# Patient Record
Sex: Female | Born: 1990 | Hispanic: No | Marital: Single | Smoking: Never smoker
Health system: Southern US, Community
[De-identification: ages and names within clinical notes are randomized; demographics above are authoritative.]

---

## 2019-01-20 ENCOUNTER — Other Ambulatory Visit: Payer: Self-pay

## 2019-01-20 ENCOUNTER — Encounter (HOSPITAL_COMMUNITY): Payer: Self-pay | Admitting: Emergency Medicine

## 2019-01-20 ENCOUNTER — Emergency Department (HOSPITAL_COMMUNITY): Payer: BC Managed Care – PPO

## 2019-01-20 ENCOUNTER — Emergency Department (HOSPITAL_COMMUNITY)
Admission: EM | Admit: 2019-01-20 | Discharge: 2019-01-20 | Disposition: A | Discharge status: Home / Self Care | Payer: BC Managed Care – PPO | Attending: Emergency Medicine | Admitting: Emergency Medicine

## 2019-01-20 DIAGNOSIS — Z20828 Contact with and (suspected) exposure to other viral communicable diseases: Secondary | ICD-10-CM | POA: Diagnosis not present

## 2019-01-20 DIAGNOSIS — R197 Diarrhea, unspecified: Secondary | ICD-10-CM

## 2019-01-20 DIAGNOSIS — R112 Nausea with vomiting, unspecified: Secondary | ICD-10-CM | POA: Insufficient documentation

## 2019-01-20 DIAGNOSIS — R0981 Nasal congestion: Secondary | ICD-10-CM | POA: Diagnosis present

## 2019-01-20 DIAGNOSIS — Z20822 Contact with and (suspected) exposure to covid-19: Secondary | ICD-10-CM

## 2019-01-20 LAB — LIPASE, BLOOD: Lipase: 19 U/L (ref 11–51)

## 2019-01-20 LAB — CBC WITH DIFFERENTIAL/PLATELET
Abs Immature Granulocytes: 0.01 10*3/uL (ref 0.00–0.07)
Basophils Absolute: 0 10*3/uL (ref 0.0–0.1)
Basophils Relative: 0 %
Eosinophils Absolute: 0.1 10*3/uL (ref 0.0–0.5)
Eosinophils Relative: 1 %
HCT: 40 % (ref 36.0–46.0)
Hemoglobin: 13 g/dL (ref 12.0–15.0)
Immature Granulocytes: 0 %
Lymphocytes Relative: 27 %
Lymphs Abs: 1.4 10*3/uL (ref 0.7–4.0)
MCH: 28.8 pg (ref 26.0–34.0)
MCHC: 32.5 g/dL (ref 30.0–36.0)
MCV: 88.7 fL (ref 80.0–100.0)
Monocytes Absolute: 0.5 10*3/uL (ref 0.1–1.0)
Monocytes Relative: 9 %
Neutro Abs: 3.2 10*3/uL (ref 1.7–7.7)
Neutrophils Relative %: 63 %
Platelets: 301 10*3/uL (ref 150–400)
RBC: 4.51 MIL/uL (ref 3.87–5.11)
RDW: 14.6 % (ref 11.5–15.5)
WBC: 5.1 10*3/uL (ref 4.0–10.5)
nRBC: 0 % (ref 0.0–0.2)

## 2019-01-20 LAB — COMPREHENSIVE METABOLIC PANEL
ALT: 17 U/L (ref 0–44)
AST: 19 U/L (ref 15–41)
Albumin: 3.2 g/dL — ABNORMAL LOW (ref 3.5–5.0)
Alkaline Phosphatase: 47 U/L (ref 38–126)
Anion gap: 11 (ref 5–15)
BUN: 8 mg/dL (ref 6–20)
CO2: 19 mmol/L — ABNORMAL LOW (ref 22–32)
Calcium: 8.7 mg/dL — ABNORMAL LOW (ref 8.9–10.3)
Chloride: 108 mmol/L (ref 98–111)
Creatinine, Ser: 0.7 mg/dL (ref 0.44–1.00)
GFR calc Af Amer: >60 mL/min (ref >60)
GFR calc non Af Amer: >60 mL/min (ref >60)
Glucose, Bld: 97 mg/dL (ref 70–99)
Potassium: 3.5 mmol/L (ref 3.5–5.1)
Sodium: 138 mmol/L (ref 135–145)
Total Bilirubin: 0.3 mg/dL (ref 0.3–1.2)
Total Protein: 6.3 g/dL — ABNORMAL LOW (ref 6.5–8.1)

## 2019-01-20 LAB — I-STAT BETA HCG BLOOD, ED (MC, WL, AP ONLY): I-stat hCG, quantitative: <5 m[IU]/mL (ref <5)

## 2019-01-20 LAB — SARS CORONAVIRUS 2 (TAT 6-24 HRS): SARS Coronavirus 2: NEGATIVE

## 2019-01-20 MED ORDER — ONDANSETRON HCL 4 MG PO TABS: 4.0000 mg | ORAL_TABLET | Freq: Three times a day (TID) | ORAL | 0 refills | Status: DC | PRN

## 2019-01-20 MED ORDER — SODIUM CHLORIDE 0.9 % IV BOLUS
500.0000 mL | Freq: Once | INTRAVENOUS | Status: AC
  Administered 2019-01-20: 500 mL via INTRAVENOUS

## 2019-01-20 MED ORDER — METHYLPREDNISOLONE SODIUM SUCC 125 MG IJ SOLR
125.0000 mg | Freq: Once | INTRAMUSCULAR | Status: AC
  Administered 2019-01-20: 12:00:00 125 mg via INTRAVENOUS
  Filled 2019-01-20: qty 2

## 2019-01-20 MED ORDER — FLUTICASONE PROPIONATE 50 MCG/ACT NA SUSP: 2.0000 | Freq: Every day | NASAL | 0 refills | Status: DC

## 2019-01-20 MED ORDER — ONDANSETRON HCL 4 MG/2ML IJ SOLN
4.0000 mg | Freq: Once | INTRAMUSCULAR | Status: AC
  Administered 2019-01-20: 4 mg via INTRAVENOUS
  Filled 2019-01-20: qty 2

## 2019-01-20 MED ORDER — ALBUTEROL SULFATE HFA 108 (90 BASE) MCG/ACT IN AERS
1.0000 | INHALATION_SPRAY | Freq: Once | RESPIRATORY_TRACT | Status: AC
  Administered 2019-01-20: 13:00:00 1 via RESPIRATORY_TRACT
  Filled 2019-01-20: qty 6.7

## 2019-01-20 MED ORDER — PREDNISONE 10 MG PO TABS: 40.0000 mg | ORAL_TABLET | Freq: Every day | ORAL | 0 refills | Status: AC

## 2019-01-20 MED ORDER — BENZONATATE 100 MG PO CAPS: 100.0000 mg | ORAL_CAPSULE | Freq: Three times a day (TID) | ORAL | 0 refills | Status: DC | PRN

## 2019-01-20 NOTE — ED Provider Notes (Signed)
[facility] EMERGENCY DEPARTMENT Provider Note   CSN: [id number] Arrival date & time: 01/20/19  1035     History   Chief Complaint Chief Complaint  Patient presents with  . COVID-19 Sx    HPI Cassandra Houston is a 28 y.o. female presents today for evaluation of acute onset, progressively worsening nasal congestion, productive cough, fever, nausea vomiting for 4-5 days.  Reports she found out her brother tested positive for COVID-19 2 days ago and states that she spent a week with him recently.  She had fever up to 102 F but this resolved yesterday.  She has had nausea and vomiting which she states occurs after smelling certain foods or eating certain foods.  Reports 5 episodes of nonbloody nonbilious emesis yesterday and states she was unable to keep down any food or fluids.  Denies any abdominal pain.  Notes some mild chest soreness with cough only.  Also notes some dyspnea on exertion, denies any shortness of breath at rest.  Reports cough is productive of brown sputum.  She is a current smoker of 1/4 pack of cigarettes daily, denies recreational drug use.  Has had some soft bowel movements but denies constipation, melena, or hematochezia.  No urinary symptoms.  Has been taking over-the-counter allergy medications without relief.     The history is provided by the patient.    History reviewed. No pertinent past medical history.  There are no active problems to display for this patient.     OB History   No obstetric history on file.      Home Medications    Prior to Admission medications   Medication Sig Start Date End Date Taking? Authorizing Provider  benzonatate (TESSALON) 100 MG capsule Take 1 capsule (100 mg total) by mouth 3 (three) times daily as needed for cough. 01/20/19   Olga Seyler A, PA-C  fluticasone (FLONASE) 50 MCG/ACT nasal spray Place 2 sprays into both nostrils daily. 01/20/19   Renn Dirocco A, PA-C  ondansetron (ZOFRAN) 4 MG tablet Take  1 tablet (4 mg total) by mouth every 8 (eight) hours as needed for nausea or vomiting. 01/20/19   Curlie Sittner A, PA-C  predniSONE (DELTASONE) 10 MG tablet Take 4 tablets (40 mg total) by mouth daily with breakfast for 5 days. 01/20/19 01/25/19  Jeanie Sewer, PA-C    Family History No family history on file.  Social History Social History   Tobacco Use  . Smoking status: Not on file  Substance Use Topics  . Alcohol use: Not on file  . Drug use: Not on file     Allergies   Patient has no known allergies.   Review of Systems Review of Systems  Constitutional: Positive for chills, fatigue and fever.  HENT: Positive for congestion. Negative for sore throat.   Respiratory: Positive for cough and shortness of breath.   Cardiovascular: Positive for chest pain (with cough only).  Gastrointestinal: Positive for diarrhea, nausea and vomiting. Negative for abdominal pain and constipation.  Genitourinary: Negative for dysuria, frequency, hematuria and urgency.  All other systems reviewed and are negative.    Physical Exam Updated Vital Signs BP 128/69 (BP Location: Right Arm)   Pulse 75   Temp 98.1 F (36.7 C) (Oral)   Resp 15   Ht 5' 4.5" (1.638 m)   Wt 132.5 kg   SpO2 99%   BMI 49.35 kg/m   Physical Exam Vitals signs and nursing note reviewed.  Constitutional:  General: She is not in acute distress.    Appearance: She is well-developed.  HENT:     Head: Normocephalic and atraumatic.  Eyes:     General:        Right eye: No discharge.        Left eye: No discharge.     Conjunctiva/sclera: Conjunctivae normal.  Neck:     Vascular: No JVD.     Trachea: No tracheal deviation.  Cardiovascular:     Rate and Rhythm: Normal rate and regular rhythm.  Pulmonary:     Effort: Pulmonary effort is normal.     Breath sounds: Wheezing present.     Comments: Diffuse wheezes, speaking in full sentences without difficulty. Ambulated in room with SpO2 saturations 96-98% on  room air Abdominal:     General: Bowel sounds are normal. There is no distension.     Palpations: Abdomen is soft.     Tenderness: There is no abdominal tenderness. There is no right CVA tenderness, left CVA tenderness, guarding or rebound.  Skin:    General: Skin is warm and dry.     Findings: No erythema.  Neurological:     Mental Status: She is alert.  Psychiatric:        Behavior: Behavior normal.      ED Treatments / Results  Labs (all labs ordered are listed, but only abnormal results are displayed) Labs Reviewed  COMPREHENSIVE METABOLIC PANEL - Abnormal; Notable for the following components:      Result Value   CO2 19 (*)    Calcium 8.7 (*)    Total Protein 6.3 (*)    Albumin 3.2 (*)    All other components within normal limits  SARS CORONAVIRUS 2 (TAT 6-24 HRS)  CBC WITH DIFFERENTIAL/PLATELET  LIPASE, BLOOD  I-STAT BETA HCG BLOOD, ED ([facility], [facility], [facility] ONLY)    EKG None  Radiology Dg Chest Portable 1 View  Result Date: 01/20/2019 CLINICAL DATA:  Cough, fever EXAM: PORTABLE CHEST 1 VIEW COMPARISON:  None. FINDINGS: Normal heart size. Normal mediastinal contour. No pneumothorax. No pleural effusion. Lungs appear clear, with no acute consolidative airspace disease and no pulmonary edema. IMPRESSION: No active disease. Electronically Signed   By: Ilona Sorrel M.D.   On: 01/20/2019 12:18    Procedures Procedures (including critical care time)  Medications Ordered in ED Medications  sodium chloride 0.9 % bolus 500 mL (0 mLs Intravenous Stopped 01/20/19 1446)  ondansetron (ZOFRAN) injection 4 mg (4 mg Intravenous Given 01/20/19 1229)  methylPREDNISolone sodium succinate (SOLU-MEDROL) 125 mg/2 mL injection 125 mg (125 mg Intravenous Given 01/20/19 1229)  albuterol (VENTOLIN HFA) 108 (90 Base) MCG/ACT inhaler 1 puff (1 puff Inhalation Given 01/20/19 1232)     Initial Impression / Assessment and Plan / ED Course  I have reviewed the triage vital signs and the nursing  notes.  Pertinent labs & imaging results that were available during my care of the patient were reviewed by me and considered in my medical decision making (see chart for details).        Cassandra Houston was evaluated in Emergency Department on 01/20/2019 for the symptoms described in the history of present illness. She was evaluated in the context of the global COVID-19 pandemic, which necessitated consideration that the patient might be at risk for infection with the SARS-CoV-2 virus that causes COVID-19. Institutional protocols and algorithms that pertain to the evaluation of patients at risk for COVID-19 are in a state of rapid change based  on information released by regulatory bodies including the CDC and federal and state organizations. These policies and algorithms were followed during the patient's care in the ED.  Patient presenting for evaluation of 4 to 5-day history of nasal congestion, productive cough, dyspnea on exertion, nausea vomiting, diarrhea.  She is afebrile, vital signs are stable.  She is nontoxic in appearance.  Has had known Covid exposure recently.  She was ambulated in the room with stable SPO2 saturation, no complaint of severe shortness of breath.  No evidence of respiratory distress today.  Chest x-ray shows no acute cardiopulmonary abnormalities.  She does have diffuse wheezes on auscultation of the lungs but this improved with albuterol and Solu-Medrol.  Her abdomen is soft and nontender, doubt acute surgical abdominal pathology.  Lab work reviewed by me shows no leukocytosis, no anemia, no metabolic derangements, no renal insufficiency.  LFTs and lipase are within normal limits.  She is not pregnant.  Suspect likely COVID-19 infection given recent exposure.  She was given Zofran in the ED and on reevaluation resting comfortably in no apparent distress, serial abdominal examinations remain benign and she is tolerating p.o. fluids without difficulty.  Discussed symptomatic  management, follow-up with PCP, and quarantining at home per current CDC guidelines.  Discussed strict ED return precautions. Patient verbalized understanding of and agreement with plan and is safe for discharge home at this time.   Final Clinical Impressions(s) / ED Diagnoses   Final diagnoses:  Suspected COVID-19 virus infection  Nausea vomiting and diarrhea    ED Discharge Orders         Ordered    predniSONE (DELTASONE) 10 MG tablet  Daily with breakfast     01/20/19 1447    ondansetron (ZOFRAN) 4 MG tablet  Every 8 hours PRN     01/20/19 1447    benzonatate (TESSALON) 100 MG capsule  3 times daily PRN     01/20/19 1447    fluticasone (FLONASE) 50 MCG/ACT nasal spray  Daily     01/20/19 1447           Jeanie SewerFawze, Peytin Dechert A, PA-C 01/20/19 1456    Milagros Lollykstra, Richard S, MD 01/21/19 [id number]

## 2019-01-20 NOTE — ED Notes (Signed)
X-ray at bedside

## 2019-01-20 NOTE — Discharge Instructions (Signed)
Your Covid test will result within 24 to 48 hours.  You can check your results on MyChart.  Instructions to access my chart or in this paperwork.  You can take 1 to 2 tablets of Tylenol every 4-6 hours as needed for fever pain.  Zofran as needed for nausea and vomiting.  Let this medicine dissolve under your tongue and wait around 10 to 15 minutes before you have anything to eat or drink.  Take Tessalon as needed for cough.  Flonase for nasal congestion.  Use the albuterol inhaler 1 to 2 puffs every 4-6 hours as needed for shortness of breath.  Start taking prednisone beginning tomorrow, you received the first dose in the emergency department today.  Do not take any ibuprofen, Advil, Aleve, or Motrin while taking prednisone.  Follow-up with primary care provider for reevaluation of symptoms.  Return to the emergency department if any concerning signs or symptoms develop such as shortness of breath, chest pains, persistent vomiting, or loss of consciousness.

## 2019-01-20 NOTE — ED Notes (Signed)
Patient given sprite and graham crackers °

## 2019-01-20 NOTE — ED Triage Notes (Signed)
-  Patient is here with reports of cough, congestion, nausea and fever for about 3 days. fever resolved yesterday. -Took some allergy medicines with no resolution  -Patient states she was with her brother for one week Brother was positive for COVID-19 2 days ago

## 2020-05-02 ENCOUNTER — Telehealth: Payer: Medicaid Other | Admitting: Oncology

## 2020-05-02 NOTE — Progress Notes (Unsigned)
Cassandra Houston, manges are scheduled for a virtual visit with your provider today.    Just as we do with appointments in the office, we must obtain your consent to participate.  Your consent will be active for this visit and any virtual visit you may have with one of our providers in the next 365 days.    If you have a MyChart account, I can also send a copy of this consent to you electronically.  All virtual visits are billed to your insurance company just like a traditional visit in the office.  As this is a virtual visit, video technology does not allow for your provider to perform a traditional examination.  This may limit your provider's ability to fully assess your condition.  If your provider identifies any concerns that need to be evaluated in person or the need to arrange testing such as labs, EKG, etc, we will make arrangements to do so.    Although advances in technology are sophisticated, we cannot ensure that it will always work on either your end or our end.  If the connection with a video visit is poor, we may have to switch to a telephone visit.  With either a video or telephone visit, we are not always able to ensure that we have a secure connection.   I need to obtain your verbal consent now.   Are you willing to proceed with your visit today?   Cassandra Houston has provided verbal consent on 05/02/2020 for a virtual visit (video or telephone).   Mauro Kaufmann, NP 05/02/2020  5:30 PM  Virtual Visit via Telephone Note  I connected with Cassandra Houston on 05/02/20 at  5:30 PM EST by telephone and verified that I am speaking with the correct person using two identifiers.  Location: Patient: Home Provider: Clinic    I discussed the limitations, risks, security and privacy concerns of performing an evaluation and management service by telephone and the availability of in person appointments. I also discussed with the patient that there may be a patient responsible charge related to this  service. The patient expressed understanding and agreed to proceed.   History of Present Illness:Cassandra Houston is a 30 yo female with no pmh who presents for concerns of a fever.     Observations/Objective:   Assessment and Plan:   Follow Up Instructions:    I discussed the assessment and treatment plan with the patient. The patient was provided an opportunity to ask questions and all were answered. The patient agreed with the plan and demonstrated an understanding of the instructions.   The patient was advised to call back or seek an in-person evaluation if the symptoms worsen or if the condition fails to improve as anticipated.  I provided *** minutes of non-face-to-face time during this encounter.   Mauro Kaufmann, NP

## 2020-07-31 ENCOUNTER — Emergency Department (HOSPITAL_COMMUNITY): Payer: Self-pay

## 2020-07-31 ENCOUNTER — Emergency Department (HOSPITAL_COMMUNITY)
Admission: EM | Admit: 2020-07-31 | Discharge: 2020-07-31 | Disposition: A | Discharge status: Home / Self Care | Payer: Self-pay | Attending: Emergency Medicine | Admitting: Emergency Medicine

## 2020-07-31 ENCOUNTER — Encounter (HOSPITAL_COMMUNITY): Payer: Self-pay | Admitting: *Deleted

## 2020-07-31 ENCOUNTER — Other Ambulatory Visit: Payer: Self-pay

## 2020-07-31 DIAGNOSIS — R059 Cough, unspecified: Secondary | ICD-10-CM

## 2020-07-31 DIAGNOSIS — Z20822 Contact with and (suspected) exposure to covid-19: Secondary | ICD-10-CM | POA: Insufficient documentation

## 2020-07-31 DIAGNOSIS — R197 Diarrhea, unspecified: Secondary | ICD-10-CM

## 2020-07-31 DIAGNOSIS — R350 Frequency of micturition: Secondary | ICD-10-CM | POA: Insufficient documentation

## 2020-07-31 DIAGNOSIS — R1011 Right upper quadrant pain: Secondary | ICD-10-CM | POA: Insufficient documentation

## 2020-07-31 DIAGNOSIS — R112 Nausea with vomiting, unspecified: Secondary | ICD-10-CM

## 2020-07-31 DIAGNOSIS — B349 Viral infection, unspecified: Secondary | ICD-10-CM | POA: Insufficient documentation

## 2020-07-31 LAB — COMPREHENSIVE METABOLIC PANEL
ALT: 16 U/L (ref 0–44)
AST: 14 U/L — ABNORMAL LOW (ref 15–41)
Albumin: 3.1 g/dL — ABNORMAL LOW (ref 3.5–5.0)
Alkaline Phosphatase: 56 U/L (ref 38–126)
Anion gap: 7 (ref 5–15)
BUN: 5 mg/dL — ABNORMAL LOW (ref 6–20)
CO2: 22 mmol/L (ref 22–32)
Calcium: 8.8 mg/dL — ABNORMAL LOW (ref 8.9–10.3)
Chloride: 109 mmol/L (ref 98–111)
Creatinine, Ser: 0.64 mg/dL (ref 0.44–1.00)
GFR, Estimated: >60 mL/min (ref >60)
Glucose, Bld: 99 mg/dL (ref 70–99)
Potassium: 3.7 mmol/L (ref 3.5–5.1)
Sodium: 138 mmol/L (ref 135–145)
Total Bilirubin: 0.7 mg/dL (ref 0.3–1.2)
Total Protein: 6 g/dL — ABNORMAL LOW (ref 6.5–8.1)

## 2020-07-31 LAB — LIPASE, BLOOD: Lipase: 23 U/L (ref 11–51)

## 2020-07-31 LAB — URINALYSIS, ROUTINE W REFLEX MICROSCOPIC
Bilirubin Urine: NEGATIVE
Glucose, UA: NEGATIVE mg/dL
Hgb urine dipstick: NEGATIVE
Ketones, ur: NEGATIVE mg/dL
Leukocytes,Ua: NEGATIVE
Nitrite: NEGATIVE
Protein, ur: NEGATIVE mg/dL
Specific Gravity, Urine: 1.027 (ref 1.005–1.030)
pH: 5 (ref 5.0–8.0)

## 2020-07-31 LAB — CBC WITH DIFFERENTIAL/PLATELET
Abs Immature Granulocytes: 0.02 10*3/uL (ref 0.00–0.07)
Basophils Absolute: 0 10*3/uL (ref 0.0–0.1)
Basophils Relative: 0 %
Eosinophils Absolute: 0 10*3/uL (ref 0.0–0.5)
Eosinophils Relative: 0 %
HCT: 37.8 % (ref 36.0–46.0)
Hemoglobin: 12.3 g/dL (ref 12.0–15.0)
Immature Granulocytes: 0 %
Lymphocytes Relative: 21 %
Lymphs Abs: 1.7 10*3/uL (ref 0.7–4.0)
MCH: 28.3 pg (ref 26.0–34.0)
MCHC: 32.5 g/dL (ref 30.0–36.0)
MCV: 87.1 fL (ref 80.0–100.0)
Monocytes Absolute: 0.5 10*3/uL (ref 0.1–1.0)
Monocytes Relative: 6 %
Neutro Abs: 5.8 10*3/uL (ref 1.7–7.7)
Neutrophils Relative %: 73 %
Platelets: 315 10*3/uL (ref 150–400)
RBC: 4.34 MIL/uL (ref 3.87–5.11)
RDW: 15.6 % — ABNORMAL HIGH (ref 11.5–15.5)
WBC: 8.1 10*3/uL (ref 4.0–10.5)
nRBC: 0 % (ref 0.0–0.2)

## 2020-07-31 LAB — I-STAT BETA HCG BLOOD, ED (MC, WL, AP ONLY): I-stat hCG, quantitative: <5 m[IU]/mL (ref <5)

## 2020-07-31 LAB — RESP PANEL BY RT-PCR (FLU A&B, COVID) ARPGX2
Influenza A by PCR: NEGATIVE
Influenza B by PCR: NEGATIVE
SARS Coronavirus 2 by RT PCR: NEGATIVE

## 2020-07-31 MED ORDER — FAMOTIDINE 40 MG PO TABS: 40.0000 mg | ORAL_TABLET | Freq: Every day | ORAL | 0 refills | Status: DC

## 2020-07-31 MED ORDER — ONDANSETRON 4 MG PO TBDP
8.0000 mg | ORAL_TABLET | Freq: Once | ORAL | Status: AC
  Administered 2020-07-31: 8 mg via ORAL
  Filled 2020-07-31: qty 2

## 2020-07-31 MED ORDER — ONDANSETRON 4 MG PO TBDP: 4.0000 mg | ORAL_TABLET | Freq: Three times a day (TID) | ORAL | 0 refills | Status: DC | PRN

## 2020-07-31 MED ORDER — ACETAMINOPHEN 500 MG PO TABS
1000.0000 mg | ORAL_TABLET | Freq: Once | ORAL | Status: AC
  Administered 2020-07-31: 1000 mg via ORAL
  Filled 2020-07-31: qty 2

## 2020-07-31 NOTE — ED Provider Notes (Signed)
[facility] EMERGENCY DEPARTMENT Provider Note   CSN: [id number] Arrival date & time: 07/31/20  1144     History Chief Complaint  Patient presents with  . Abdominal Pain  . Emesis    Cassandra Houston is a 30 y.o. female.  HPI Patient is a 30 year old female with past medical history pertinent presented today with complaints of nausea vomiting abdominal pain, urinary frequency, cough and fatigue.  She states she is also has some intermittent fevers at home T-max of 101  Patient states she has had nonbloody nonbilious emesis.  She states 2 or 3 episodes per day.  She states her stool is nonbloody without any melena or hematochezia.  She states it is loose but not quite watery. She denies any known COVID exposure and states that her son has similar symptoms.  Husband at home is not having any symptoms.  Denies any recent fevers.  Denies any chest pain or shortness of breath.  No lightheadedness or dizziness.       History reviewed. No pertinent past medical history.  There are no problems to display for this patient.   History reviewed. No pertinent surgical history.   OB History   No obstetric history on file.     History reviewed. No pertinent family history.  Social History   Tobacco Use  . Smoking status: Never Smoker    Home Medications Prior to Admission medications   Medication Sig Start Date End Date Taking? Authorizing Provider  famotidine (PEPCID) 40 MG tablet Take 1 tablet (40 mg total) by mouth daily for 7 days. 07/31/20 08/07/20 Yes Johnasia Liese S, PA  ondansetron (ZOFRAN ODT) 4 MG disintegrating tablet Take 1 tablet (4 mg total) by mouth every 8 (eight) hours as needed for nausea or vomiting. 07/31/20  Yes Gailen Shelter, PA    Allergies    Patient has no known allergies.  Review of Systems   Review of Systems  Constitutional: Negative for chills and fever.  HENT: Negative for congestion.   Eyes: Negative for pain.   Respiratory: Negative for cough and shortness of breath.   Cardiovascular: Negative for chest pain and leg swelling.  Gastrointestinal: Positive for abdominal pain, diarrhea, nausea and vomiting.  Genitourinary: Negative for dysuria.  Musculoskeletal: Negative for myalgias.  Skin: Negative for rash.  Neurological: Negative for dizziness and headaches.    Physical Exam Updated Vital Signs BP 112/66 (BP Location: Right Arm)   Pulse (!) 58   Temp 98 F (36.7 C) (Oral)   Resp 16   LMP 07/31/2020   SpO2 100%   Physical Exam Vitals and nursing note reviewed.  Constitutional:      General: She is not in acute distress.    Comments: Patient is well-appearing 30 year old female uncomfortable but in no acute distress.  HENT:     Head: Normocephalic and atraumatic.     Nose: Nose normal.     Mouth/Throat:     Mouth: Mucous membranes are moist.  Eyes:     General: No scleral icterus. Cardiovascular:     Rate and Rhythm: Normal rate and regular rhythm.     Pulses: Normal pulses.     Heart sounds: Normal heart sounds.  Pulmonary:     Effort: Pulmonary effort is normal. No respiratory distress.     Breath sounds: No wheezing.  Abdominal:     Palpations: Abdomen is soft.     Tenderness: There is abdominal tenderness.     Comments: Abdomen is soft,  some epigastric tenderness and mild right upper quadrant tenderness with negative Murphy sign.  No guarding or rebound of the abdomen no CVA tenderness.  Musculoskeletal:     Cervical back: Normal range of motion.     Right lower leg: No edema.     Left lower leg: No edema.  Skin:    General: Skin is warm and dry.     Capillary Refill: Capillary refill takes less than 2 seconds.  Neurological:     Mental Status: She is alert. Mental status is at baseline.  Psychiatric:        Mood and Affect: Mood normal.        Behavior: Behavior normal.     ED Results / Procedures / Treatments   Labs (all labs ordered are listed, but only  abnormal results are displayed) Labs Reviewed  COMPREHENSIVE METABOLIC PANEL - Abnormal; Notable for the following components:      Result Value   BUN 5 (*)    Calcium 8.8 (*)    Total Protein 6.0 (*)    Albumin 3.1 (*)    AST 14 (*)    All other components within normal limits  CBC WITH DIFFERENTIAL/PLATELET - Abnormal; Notable for the following components:   RDW 15.6 (*)    All other components within normal limits  URINALYSIS, ROUTINE W REFLEX MICROSCOPIC - Abnormal; Notable for the following components:   APPearance HAZY (*)    All other components within normal limits  RESP PANEL BY RT-PCR (FLU A&B, COVID) ARPGX2  LIPASE, BLOOD  I-STAT BETA HCG BLOOD, ED ([facility], [facility], [facility] ONLY)    EKG None  Radiology No results found.  Procedures Procedures   Medications Ordered in ED Medications  acetaminophen (TYLENOL) tablet 1,000 mg (1,000 mg Oral Given 07/31/20 1707)  ondansetron (ZOFRAN-ODT) disintegrating tablet 8 mg (8 mg Oral Given 07/31/20 1708)    ED Course  I have reviewed the triage vital signs and the nursing notes.  Pertinent labs & imaging results that were available during my care of the patient were reviewed by me and considered in my medical decision making (see chart for details).    MDM Rules/Calculators/A&P                          Patient is a well-appearing 30 year old female presented today with nausea vomiting diarrhea subjective fevers and some cough.  Symptoms been ongoing for approximately 5 days.  She denies any chest pain or shortness of breath.  Physical exam is relatively reassuring.  Patient has vital signs within normal limits.  Moist oromucosa.  Abdomen is soft with some epigastric tenderness and right upper quadrant tenderness but no guarding or rebound.  Negative Murphy sign and low suspicion for cholecystitis or choledocholithiasis.  However will obtain right upper quadrant ultrasound given the severity of her reported symptoms including her  describing fevers.  She has had no antipyretic medicines today and is afebrile however.  CMP with no significant electrolyte derangements.  CBC without leukocytosis or anemia.  Urinalysis unremarkable.  I-STAT hCG negative.  COVID and influenza negative.  Patient feels much improved after Zofran and Tylenol.  Will discharge home with recommendations for p.o. fluids and Zofran use.  She will use Tylenol as well.  Also monitor some possibility of gastritis therefore will prescribe Pepcid as well.  Return precautions given.  Cassandra Houston was evaluated in Emergency Department on 08/04/2020 for the symptoms described in the history of present  illness. She was evaluated in the context of the global COVID-19 pandemic, which necessitated consideration that the patient might be at risk for infection with the SARS-CoV-2 virus that causes COVID-19. Institutional protocols and algorithms that pertain to the evaluation of patients at risk for COVID-19 are in a state of rapid change based on information released by regulatory bodies including the CDC and federal and state organizations. These policies and algorithms were followed during the patient's care in the ED.   Final Clinical Impression(s) / ED Diagnoses Final diagnoses:  RUQ pain  Nausea vomiting and diarrhea  Viral illness    Rx / DC Orders ED Discharge Orders         Ordered    ondansetron (ZOFRAN ODT) 4 MG disintegrating tablet  Every 8 hours PRN        07/31/20 1730    famotidine (PEPCID) 40 MG tablet  Daily        07/31/20 1730           Solon [place]a [place], [place] 08/04/20 1544    Jacalyn Lefevre, MD 08/05/20 [phone]

## 2020-07-31 NOTE — ED Triage Notes (Signed)
Pt reports lower abd pain x 5 days with n/v/d. Reports fever x 3 days. Having pain with urination.

## 2020-07-31 NOTE — Discharge Instructions (Addendum)
Given that you are able to keep down fluids after the nausea medicine we tried here in the ER I will send you home with the same medication.  I recommend drinking plenty of water, relatively bland foods.  Also recommend follow-up with your primary care provider.  Your nasal swab was negative for COVID and influenza.  Recommend taking Tylenol 1000 mg every 6 hours for pain.  You may alternate with 600 mg of ibuprofen.  I also prescribed you Pepcid which I would like to take once daily.  You may always return to the ER for any new or concerning symptoms.

## 2020-07-31 NOTE — ED Provider Notes (Signed)
Emergency Medicine Provider Triage Evaluation Note  Cassandra Houston , a 30 y.o. female  was evaluated in triage.  Pt complains of abd pain, nvd. Reports she had fevers, urinary sxs and cough. States her son has similar sxs. Denies known covid exposures  Review of Systems  Positive: abd pain, nvd, urinary sxs, cough Negative: sob  Physical Exam  BP (!) 140/91 (BP Location: Left Arm)   Pulse (!) 58   Temp 98.3 F (36.8 C) (Oral)   Resp 16   SpO2 96%  Gen:   Awake, no distress   Resp:  Normal effort  MSK:   Moves extremities without difficulty  Other:    Medical Decision Making  Medically screening exam initiated at 12:35 PM.  Appropriate orders placed.  Lynelle Tull was informed that the remainder of the evaluation will be completed by another provider, this initial triage assessment does not replace that evaluation, and the importance of remaining in the ED until their evaluation is complete.    Rayne Du 07/31/20 1237    Gerhard Munch, MD 07/31/20 1538

## 2020-12-06 ENCOUNTER — Emergency Department (HOSPITAL_COMMUNITY): Payer: Self-pay

## 2020-12-06 ENCOUNTER — Emergency Department (HOSPITAL_COMMUNITY)
Admission: EM | Admit: 2020-12-06 | Discharge: 2020-12-06 | Disposition: A | Discharge status: Home / Self Care | Payer: Self-pay | Attending: Emergency Medicine | Admitting: Emergency Medicine

## 2020-12-06 ENCOUNTER — Other Ambulatory Visit: Payer: Self-pay

## 2020-12-06 ENCOUNTER — Encounter (HOSPITAL_COMMUNITY): Payer: Self-pay | Admitting: Emergency Medicine

## 2020-12-06 DIAGNOSIS — R824 Acetonuria: Secondary | ICD-10-CM | POA: Insufficient documentation

## 2020-12-06 DIAGNOSIS — E860 Dehydration: Secondary | ICD-10-CM | POA: Insufficient documentation

## 2020-12-06 DIAGNOSIS — R112 Nausea with vomiting, unspecified: Secondary | ICD-10-CM | POA: Insufficient documentation

## 2020-12-06 DIAGNOSIS — Z20822 Contact with and (suspected) exposure to covid-19: Secondary | ICD-10-CM | POA: Insufficient documentation

## 2020-12-06 DIAGNOSIS — R000 Tachycardia, unspecified: Secondary | ICD-10-CM | POA: Insufficient documentation

## 2020-12-06 DIAGNOSIS — R509 Fever, unspecified: Secondary | ICD-10-CM | POA: Insufficient documentation

## 2020-12-06 DIAGNOSIS — R197 Diarrhea, unspecified: Secondary | ICD-10-CM | POA: Insufficient documentation

## 2020-12-06 DIAGNOSIS — R809 Proteinuria, unspecified: Secondary | ICD-10-CM | POA: Insufficient documentation

## 2020-12-06 DIAGNOSIS — R059 Cough, unspecified: Secondary | ICD-10-CM | POA: Insufficient documentation

## 2020-12-06 DIAGNOSIS — R0782 Intercostal pain: Secondary | ICD-10-CM | POA: Insufficient documentation

## 2020-12-06 DIAGNOSIS — R319 Hematuria, unspecified: Secondary | ICD-10-CM | POA: Insufficient documentation

## 2020-12-06 DIAGNOSIS — R051 Acute cough: Secondary | ICD-10-CM

## 2020-12-06 LAB — URINALYSIS, ROUTINE W REFLEX MICROSCOPIC
Bacteria, UA: NONE SEEN
Bilirubin Urine: NEGATIVE
Glucose, UA: NEGATIVE mg/dL
Ketones, ur: 80 mg/dL — AB
Leukocytes,Ua: NEGATIVE
Nitrite: NEGATIVE
Protein, ur: 30 mg/dL — AB
Specific Gravity, Urine: 1.025 (ref 1.005–1.030)
pH: 5 (ref 5.0–8.0)

## 2020-12-06 LAB — CBC WITH DIFFERENTIAL/PLATELET
Abs Immature Granulocytes: 0.03 10*3/uL (ref 0.00–0.07)
Basophils Absolute: 0 10*3/uL (ref 0.0–0.1)
Basophils Relative: 0 %
Eosinophils Absolute: 0.2 10*3/uL (ref 0.0–0.5)
Eosinophils Relative: 1 %
HCT: 43.2 % (ref 36.0–46.0)
Hemoglobin: 14.2 g/dL (ref 12.0–15.0)
Immature Granulocytes: 0 %
Lymphocytes Relative: 13 %
Lymphs Abs: 1.4 10*3/uL (ref 0.7–4.0)
MCH: 29.2 pg (ref 26.0–34.0)
MCHC: 32.9 g/dL (ref 30.0–36.0)
MCV: 88.9 fL (ref 80.0–100.0)
Monocytes Absolute: 0.8 10*3/uL (ref 0.1–1.0)
Monocytes Relative: 7 %
Neutro Abs: 8.7 10*3/uL — ABNORMAL HIGH (ref 1.7–7.7)
Neutrophils Relative %: 79 %
Platelets: 282 10*3/uL (ref 150–400)
RBC: 4.86 MIL/uL (ref 3.87–5.11)
RDW: 15.4 % (ref 11.5–15.5)
WBC: 11.1 10*3/uL — ABNORMAL HIGH (ref 4.0–10.5)
nRBC: 0 % (ref 0.0–0.2)

## 2020-12-06 LAB — COMPREHENSIVE METABOLIC PANEL
ALT: 14 U/L (ref 0–44)
AST: 16 U/L (ref 15–41)
Albumin: 3.9 g/dL (ref 3.5–5.0)
Alkaline Phosphatase: 62 U/L (ref 38–126)
Anion gap: 12 (ref 5–15)
BUN: 7 mg/dL (ref 6–20)
CO2: 21 mmol/L — ABNORMAL LOW (ref 22–32)
Calcium: 9 mg/dL (ref 8.9–10.3)
Chloride: 106 mmol/L (ref 98–111)
Creatinine, Ser: 0.67 mg/dL (ref 0.44–1.00)
GFR, Estimated: >60 mL/min (ref >60)
Glucose, Bld: 101 mg/dL — ABNORMAL HIGH (ref 70–99)
Potassium: 3.9 mmol/L (ref 3.5–5.1)
Sodium: 139 mmol/L (ref 135–145)
Total Bilirubin: 0.8 mg/dL (ref 0.3–1.2)
Total Protein: 7.5 g/dL (ref 6.5–8.1)

## 2020-12-06 LAB — I-STAT BETA HCG BLOOD, ED (MC, WL, AP ONLY): I-stat hCG, quantitative: <5 m[IU]/mL (ref <5)

## 2020-12-06 LAB — RESP PANEL BY RT-PCR (FLU A&B, COVID) ARPGX2
Influenza A by PCR: NEGATIVE
Influenza B by PCR: NEGATIVE
SARS Coronavirus 2 by RT PCR: NEGATIVE

## 2020-12-06 LAB — LIPASE, BLOOD: Lipase: 22 U/L (ref 11–51)

## 2020-12-06 MED ORDER — ONDANSETRON 4 MG PO TBDP: 4.0000 mg | ORAL_TABLET | Freq: Three times a day (TID) | ORAL | 0 refills | Status: AC | PRN

## 2020-12-06 MED ORDER — BENZONATATE 100 MG PO CAPS
200.0000 mg | ORAL_CAPSULE | Freq: Once | ORAL | Status: AC
  Administered 2020-12-06: 200 mg via ORAL
  Filled 2020-12-06: qty 2

## 2020-12-06 MED ORDER — ONDANSETRON HCL 4 MG/2ML IJ SOLN
4.0000 mg | Freq: Once | INTRAMUSCULAR | Status: AC
Start: 2020-12-06 — End: 2020-12-06
  Administered 2020-12-06: 4 mg via INTRAVENOUS
  Filled 2020-12-06: qty 2

## 2020-12-06 MED ORDER — BENZONATATE 100 MG PO CAPS: 100.0000 mg | ORAL_CAPSULE | Freq: Three times a day (TID) | ORAL | 0 refills | Status: AC

## 2020-12-06 MED ORDER — ACETAMINOPHEN 325 MG PO TABS
650.0000 mg | ORAL_TABLET | Freq: Once | ORAL | Status: AC
  Administered 2020-12-06: 650 mg via ORAL
  Filled 2020-12-06: qty 2

## 2020-12-06 MED ORDER — SODIUM CHLORIDE 0.9 % IV BOLUS
1000.0000 mL | Freq: Once | INTRAVENOUS | Status: AC
Start: 2020-12-06 — End: 2020-12-06
  Administered 2020-12-06: 1000 mL via INTRAVENOUS

## 2020-12-06 NOTE — ED Notes (Signed)
Pt drank a soda no issues.  PO challenge success.

## 2020-12-06 NOTE — ED Provider Notes (Signed)
[facility]-EMERGENCY DEPT Provider Note   CSN: [id number] Arrival date & time: 12/06/20  0904     History Chief Complaint  Patient presents with   Fever   Diarrhea    Cassandra Houston is a 30 y.o. female with no significant past medical history who presents to the ED due to nausea, vomiting, diarrhea, cough, and fever x4 days.  Patient admits to numerous episodes of nonbloody, nonbilious emesis and nonbloody diarrhea over the past 4 days.  Patient states she has not vomited since early this morning.  Patient's son is also sick with similar symptoms.  Patient is vaccinated against COVID-19 including her booster shot.  Denies chest pain however, admits to some shortness of breath.  Endorses bilateral rib pain only while coughing.  Denies lower extremity edema.  No history of blood clots.  She is not currently on any birth control.  Denies associated abdominal pain, urinary and vaginal symptoms.  She has not tried anything for symptoms prior to arrival.  Denies melena, hematochezia, and hematemesis.  Aggravating or alleviating factors.  History obtained from patient and past medical records. No interpreter used during encounter.       No past medical history on file.  There are no problems to display for this patient.   History reviewed. No pertinent surgical history.   OB History   No obstetric history on file.     No family history on file.  Social History   Tobacco Use   Smoking status: Never  Substance Use Topics   Alcohol use: Yes    Comment: rarely   Drug use: Not Currently    Home Medications Prior to Admission medications   Medication Sig Start Date End Date Taking? Authorizing Provider  benzonatate (TESSALON) 100 MG capsule Take 1 capsule (100 mg total) by mouth every 8 (eight) hours. 12/06/20  Yes Keandre Linden, Merla Riches, PA-C  ferrous sulfate 324 MG TBEC Take 324 mg by mouth daily.   Yes [provider]  ondansetron (ZOFRAN ODT) 4 MG  disintegrating tablet Take 1 tablet (4 mg total) by mouth every 8 (eight) hours as needed for nausea or vomiting. 12/06/20  Yes Mannie Stabile, PA-C    Allergies    Patient has no known allergies.  Review of Systems   Review of Systems  Constitutional:  Positive for chills and fever.  Respiratory:  Positive for cough and shortness of breath.   Cardiovascular:  Negative for chest pain and leg swelling.  Gastrointestinal:  Positive for diarrhea, nausea and vomiting. Negative for abdominal pain.  Genitourinary:  Negative for dysuria and vaginal discharge.  All other systems reviewed and are negative.  Physical Exam Updated Vital Signs BP (!) 143/83   Pulse 71   Temp 98.6 F (37 C) (Oral)   Resp 14   Ht 5\' 4"  (1.626 m)   Wt 125.6 kg   LMP 11/15/2020   SpO2 99%   BMI 47.55 kg/m   Physical Exam Vitals and nursing note reviewed.  Constitutional:      General: She is not in acute distress.    Appearance: She is not ill-appearing.  HENT:     Head: Normocephalic.  Eyes:     Pupils: Pupils are equal, round, and reactive to light.  Cardiovascular:     Rate and Rhythm: Regular rhythm. Tachycardia present.     Pulses: Normal pulses.     Heart sounds: Normal heart sounds. No murmur heard.   No friction rub. No gallop.  Pulmonary:     Effort: Pulmonary effort is normal.     Breath sounds: Normal breath sounds.     Comments: Respirations equal and unlabored, patient able to speak in full sentences, lungs clear to auscultation bilaterally Abdominal:     General: Abdomen is flat. There is no distension.     Palpations: Abdomen is soft.     Tenderness: There is no abdominal tenderness. There is no guarding or rebound.     Comments: Abdomen soft, nondistended, nontender to palpation in all quadrants without guarding or peritoneal signs. No rebound.   Musculoskeletal:        General: Normal range of motion.     Cervical back: Neck supple.  Skin:    General: Skin is warm and dry.   Neurological:     General: No focal deficit present.     Mental Status: She is alert.  Psychiatric:        Mood and Affect: Mood normal.        Behavior: Behavior normal.    ED Results / Procedures / Treatments   Labs (all labs ordered are listed, but only abnormal results are displayed) Labs Reviewed  CBC WITH DIFFERENTIAL/PLATELET - Abnormal; Notable for the following components:      Result Value   WBC 11.1 (*)    Neutro Abs 8.7 (*)    All other components within normal limits  COMPREHENSIVE METABOLIC PANEL - Abnormal; Notable for the following components:   CO2 21 (*)    Glucose, Bld 101 (*)    All other components within normal limits  URINALYSIS, ROUTINE W REFLEX MICROSCOPIC - Abnormal; Notable for the following components:   APPearance HAZY (*)    Hgb urine dipstick SMALL (*)    Ketones, ur 80 (*)    Protein, ur 30 (*)    All other components within normal limits  RESP PANEL BY RT-PCR (FLU A&B, COVID) ARPGX2  LIPASE, BLOOD  I-STAT BETA HCG BLOOD, ED ([facility], [facility], [facility] ONLY)    EKG None  Radiology DG Chest Portable 1 View  Result Date: 12/06/2020 CLINICAL DATA:  Nausea.  Cough EXAM: PORTABLE CHEST 1 VIEW COMPARISON:  Chest radiograph 07/31/2020. FINDINGS: The heart size and mediastinal contours are within normal limits. Both lungs are clear. The visualized skeletal structures are unremarkable. IMPRESSION: No active disease. Electronically Signed   By: Annia Belt M.D.   On: 12/06/2020 11:31    Procedures Procedures   Medications Ordered in ED Medications  acetaminophen (TYLENOL) tablet 650 mg (650 mg Oral Given 12/06/20 1010)  sodium chloride 0.9 % bolus 1,000 mL (1,000 mLs Intravenous New Bag/Given 12/06/20 1016)  ondansetron (ZOFRAN) injection 4 mg (4 mg Intravenous Given 12/06/20 1016)  benzonatate (TESSALON) capsule 200 mg (200 mg Oral Given 12/06/20 1010)    ED Course  I have reviewed the triage vital signs and the nursing notes.  Pertinent labs & imaging  results that were available during my care of the patient were reviewed by me and considered in my medical decision making (see chart for details).  Clinical Course as of 12/06/20 1216  Sat Dec 06, 2020  0939 WBC(!): 11.1 [CA]  1101 Ketones, ur(!): 80 [CA]  1101 Protein(!): 30 [CA]    Clinical Course User Index [CA] Jesusita Oka   MDM Rules/Calculators/A&P                          30 year old female presents to the  ED due to nausea, vomiting, diarrhea, and cough x4 days.  Son sick with similar symptoms.  Patient is vaccinated gets COVID-19 including her booster.  Upon arrival, patient afebrile, mildly tachycardic at 103 with normal oxygen saturation.  Patient nontoxic-appearing.  Benign physical exam.  Abdomen soft, nondistended, nontender.  Low suspicion for acute abdomen.  Lungs clear to auscultation bilaterally.  Routine labs ordered.  COVID test.  Chest x-ray to rule out pneumonia.  IV fluids, Zofran, Tessalon given.  CBC significant for mild leukocytosis 11.1.  Normal hemoglobin.  CMP significant for hyperglycemia 101.  Normal renal function.  No major electrolyte derangements.  COVID/influenza negative.  Pregnancy test negative.  Lipase normal.  UA significant for small hematuria, ketonuria, proteinuria.  Ketonuria and proteinuria likely due to dehydration from nausea, vomiting, diarrhea.  IV fluids given.  Chest x-ray personally reviewed which is negative for signs of pneumonia, pneumothorax, or widened mediastinum.  EKG demonstrates normal sinus rhythm with no signs of acute ischemia.  Patient's heart rate normalized after IV fluids and Tylenol.  Low suspicion for cardiac etiology of tachycardia.  No episodes of hypoxia.  Low suspicion for PE/DVT.  Suspect viral etiology.  Patient admits to improvement in symptoms after IV fluids and Zofran.  Patient able to tolerate p.o. at bedside without difficulty.  Patient discharged with symptomatic treatment.  Advised patient follow-up  with PCP within 1 week for further evaluation. Strict ED precautions discussed with patient. Patient states understanding and agrees to plan. Patient discharged home in no acute distress and stable vitals  Final Clinical Impression(s) / ED Diagnoses Final diagnoses:  Nausea vomiting and diarrhea  Acute cough    Rx / DC Orders ED Discharge Orders          Ordered    ondansetron (ZOFRAN ODT) 4 MG disintegrating tablet  Every 8 hours PRN        12/06/20 1214    benzonatate (TESSALON) 100 MG capsule  Every 8 hours        12/06/20 1214             Jesusita Oka 12/06/20 1216    Arby Barrette, MD 12/07/20 1122

## 2020-12-06 NOTE — ED Triage Notes (Signed)
Patient states "I am sick as hell." Reports nausea, emesis, cough, diarrhea and fever of 101.3 2 days ago. Son is also sick. Has not been Covid tested. Son tested covid negative yesterday. Did not take any medications for fever.

## 2020-12-06 NOTE — Discharge Instructions (Addendum)
It was a pleasure taking care of you today.  As discussed, all of your labs were reassuring.  Your COVID and influenza test were negative.  Your chest x-ray did not show signs of pneumonia.  I suspect your symptoms are related to a viral infection.  I am sending you home with nausea and cough medication.  Take as needed.  Please follow-up with PCP within 1 week for further evaluation.  Return to the ER for any worsening symptoms.

## 2021-10-18 ENCOUNTER — Emergency Department (HOSPITAL_COMMUNITY)
Admission: EM | Admit: 2021-10-18 | Discharge: 2021-10-18 | Discharge status: Against Medical Advice | Payer: Medicaid Other | Attending: Emergency Medicine | Admitting: Emergency Medicine

## 2021-10-18 ENCOUNTER — Other Ambulatory Visit: Payer: Self-pay

## 2021-10-18 ENCOUNTER — Encounter (HOSPITAL_COMMUNITY): Payer: Self-pay | Admitting: Emergency Medicine

## 2021-10-18 DIAGNOSIS — K130 Diseases of lips: Secondary | ICD-10-CM | POA: Insufficient documentation

## 2021-10-18 DIAGNOSIS — R220 Localized swelling, mass and lump, head: Secondary | ICD-10-CM | POA: Insufficient documentation

## 2021-10-18 DIAGNOSIS — Z5321 Procedure and treatment not carried out due to patient leaving prior to being seen by health care provider: Secondary | ICD-10-CM | POA: Insufficient documentation

## 2021-10-18 NOTE — ED Triage Notes (Signed)
Patient here with complaint of lip swelling that started two days ago. Patient states swelling initially started two weeks ago with an insect bite but has started to improve until two days ago. Patient is alert, speaking in complete sentences, and is in no apparent distress at this time.

## 2021-10-18 NOTE — ED Notes (Signed)
Called pt for room x4. No response. Moved pt from floor.

## 2021-10-26 ENCOUNTER — Other Ambulatory Visit: Payer: Self-pay

## 2021-10-26 ENCOUNTER — Encounter (HOSPITAL_COMMUNITY): Payer: Self-pay

## 2021-10-26 ENCOUNTER — Emergency Department (HOSPITAL_COMMUNITY)
Admission: EM | Admit: 2021-10-26 | Discharge: 2021-10-26 | Disposition: A | Discharge status: Home / Self Care | Payer: Medicaid Other | Attending: Emergency Medicine | Admitting: Emergency Medicine

## 2021-10-26 ENCOUNTER — Other Ambulatory Visit (HOSPITAL_COMMUNITY): Payer: Self-pay

## 2021-10-26 DIAGNOSIS — M795 Residual foreign body in soft tissue: Secondary | ICD-10-CM

## 2021-10-26 DIAGNOSIS — S00551A Superficial foreign body of lip, initial encounter: Secondary | ICD-10-CM | POA: Insufficient documentation

## 2021-10-26 DIAGNOSIS — X58XXXA Exposure to other specified factors, initial encounter: Secondary | ICD-10-CM | POA: Insufficient documentation

## 2021-10-26 MED ORDER — CEPHALEXIN 500 MG PO CAPS
500.0000 mg | ORAL_CAPSULE | Freq: Four times a day (QID) | ORAL | 0 refills | Status: AC
  Filled 2021-10-26: qty 20, 5d supply, fill #0

## 2021-10-26 MED ORDER — LIDOCAINE-EPINEPHRINE (PF) 2 %-1:200000 IJ SOLN
10.0000 mL | Freq: Once | INTRAMUSCULAR | Status: AC
  Administered 2021-10-26: 10 mL
  Filled 2021-10-26: qty 20

## 2021-10-26 NOTE — ED Provider Notes (Signed)
[facility]-EMERGENCY DEPT Provider Note   CSN: [id number] Arrival date & time: 10/26/21  4481     History  Chief Complaint  Patient presents with   Oral Swelling    Cassandra Houston is a 31 y.o. female.  HPI 31 year old female presents today complaining of lip pain and swelling.  She had a piercing in her lip.  She states that the bar got caught between her teeth about a week ago.  This caused some pain and swelling in the area.  She attempted to unscrew it and states that she felt that she made it worse.  After that she got more swelling and has not been able to remove the piercing.  She has noted some discharge and increased pain.  She has not had any fever or chills.  She denies any significant past medical history    Home Medications Prior to Admission medications   Medication Sig Start Date End Date Taking? Authorizing Provider  cephALEXin (KEFLEX) 500 MG capsule Take 1 capsule by mouth 4 times daily. 10/26/21  Yes Margarita Grizzle, MD  benzonatate (TESSALON) 100 MG capsule Take 1 capsule (100 mg total) by mouth every 8 (eight) hours. 12/06/20   Mannie Stabile, PA-C  ferrous sulfate 324 MG TBEC Take 324 mg by mouth daily.    [provider]  ondansetron (ZOFRAN ODT) 4 MG disintegrating tablet Take 1 tablet (4 mg total) by mouth every 8 (eight) hours as needed for nausea or vomiting. 12/06/20   Mannie Stabile, PA-C      Allergies    Patient has no known allergies.    Review of Systems   Review of Systems  Physical Exam Updated Vital Signs BP (!) 162/80 (BP Location: Left Arm)   Pulse 74   Temp 98.3 F (36.8 C) (Oral)   Resp 16   SpO2 100%  Physical Exam Vitals and nursing note reviewed.  HENT:     Head: Normocephalic.     Right Ear: External ear normal.     Left Ear: External ear normal.     Nose: Nose normal.     Mouth/Throat:     Pharynx: Oropharynx is clear.     Comments: Upper lip with some discharge noted at the external  site of piercing with no piercing visible There is diffuse swelling of the lip with tenderness No redness  Skin:    General: Skin is warm.  Neurological:     General: No focal deficit present.     Mental Status: She is alert.  Psychiatric:        Mood and Affect: Mood normal.     ED Results / Procedures / Treatments   Labs (all labs ordered are listed, but only abnormal results are displayed) Labs Reviewed - No data to display  EKG None  Radiology No results found.  Procedures .Foreign Body Removal  Date/Time: 10/26/2021 7:55 AM  Performed by: Margarita Grizzle, MD Authorized by: Margarita Grizzle, MD  Consent: Verbal consent obtained. Risks and benefits: risks, benefits and alternatives were discussed Consent given by: patient Patient understanding: patient states understanding of the procedure being performed Patient identity confirmed: verbally with patient Time out: Immediately prior to procedure a "time out" was called to verify the correct patient, procedure, equipment, support staff and site/side marked as required. Body area: skin General location: head/neck Location details: face Anesthesia: local infiltration  Anesthesia: Local Anesthetic: lidocaine 1% with epinephrine Anesthetic total: 7 mL  Sedation: Patient sedated: no  Patient restrained: no Patient cooperative: yes Removal mechanism: scalpel and forceps Depth: deep Complexity: complex 1 objects recovered. Post-procedure assessment: foreign body removed Patient tolerance: patient tolerated the procedure well with no immediate complications Comments: Piercing with ball stem and base removed Area irrigated No significant discharge noted Palpable swelling noted      Medications Ordered in ED Medications  lidocaine-EPINEPHrine (XYLOCAINE W/EPI) 2 %-1:200000 (PF) injection 10 mL (10 mLs Infiltration Given by Other 10/26/21 6433)    ED Course/ Medical Decision Making/ A&P                            Medical Decision Making 31 year old female presents today with swelling to her lip and piercing embedded Piercing was removed area was opened and irrigated Plan Keflex Discussed return precautions need for follow-up patient voices understanding  Risk Prescription drug management.           Final Clinical Impression(s) / ED Diagnoses Final diagnoses:  Foreign body (FB) in soft tissue    Rx / DC Orders ED Discharge Orders          Ordered    cephALEXin (KEFLEX) 500 MG capsule  4 times daily        10/26/21 0758              Margarita Grizzle, MD 10/26/21 0800

## 2021-10-26 NOTE — Discharge Instructions (Addendum)
Please use saline washes Eat soft food that will not become embedded in soft tissue Take antibiotics as prescribed. Return if worsening symptoms especially swelling, red streaking, or worsening discharge Do not replace piercing

## 2021-10-26 NOTE — ED Triage Notes (Signed)
Pt reports upper lip swelling for over a week. Pt reports thinking it is related to a piercing she has. Pt also has pus coming from the area. Pt was seen at [facility] for lip swelling related to an insect bite on 8/13.

## 2022-02-01 ENCOUNTER — Emergency Department (HOSPITAL_COMMUNITY)
Admission: EM | Admit: 2022-02-01 | Discharge: 2022-02-01 | Disposition: A | Discharge status: Home / Self Care | Payer: Medicaid Other | Attending: Emergency Medicine | Admitting: Emergency Medicine

## 2022-02-01 ENCOUNTER — Other Ambulatory Visit: Payer: Self-pay

## 2022-02-01 ENCOUNTER — Encounter (HOSPITAL_COMMUNITY): Payer: Self-pay

## 2022-02-01 DIAGNOSIS — R109 Unspecified abdominal pain: Secondary | ICD-10-CM | POA: Insufficient documentation

## 2022-02-01 DIAGNOSIS — Z3491 Encounter for supervision of normal pregnancy, unspecified, first trimester: Secondary | ICD-10-CM

## 2022-02-01 DIAGNOSIS — O99511 Diseases of the respiratory system complicating pregnancy, first trimester: Secondary | ICD-10-CM | POA: Insufficient documentation

## 2022-02-01 DIAGNOSIS — Z3A00 Weeks of gestation of pregnancy not specified: Secondary | ICD-10-CM | POA: Insufficient documentation

## 2022-02-01 DIAGNOSIS — O219 Vomiting of pregnancy, unspecified: Secondary | ICD-10-CM | POA: Insufficient documentation

## 2022-02-01 DIAGNOSIS — O26891 Other specified pregnancy related conditions, first trimester: Secondary | ICD-10-CM | POA: Insufficient documentation

## 2022-02-01 DIAGNOSIS — J069 Acute upper respiratory infection, unspecified: Secondary | ICD-10-CM | POA: Insufficient documentation

## 2022-02-01 LAB — URINALYSIS, ROUTINE W REFLEX MICROSCOPIC
Bilirubin Urine: NEGATIVE
Glucose, UA: 50 mg/dL — AB
Hgb urine dipstick: NEGATIVE
Ketones, ur: 20 mg/dL — AB
Nitrite: NEGATIVE
Protein, ur: >=300 mg/dL — AB
Specific Gravity, Urine: 1.025 (ref 1.005–1.030)
pH: 5 (ref 5.0–8.0)

## 2022-02-01 LAB — COMPREHENSIVE METABOLIC PANEL
ALT: 9 U/L (ref 0–44)
AST: 19 U/L (ref 15–41)
Albumin: 3.2 g/dL — ABNORMAL LOW (ref 3.5–5.0)
Alkaline Phosphatase: 51 U/L (ref 38–126)
Anion gap: 9 (ref 5–15)
BUN: <5 mg/dL — ABNORMAL LOW (ref 6–20)
CO2: 18 mmol/L — ABNORMAL LOW (ref 22–32)
Calcium: 8.7 mg/dL — ABNORMAL LOW (ref 8.9–10.3)
Chloride: 110 mmol/L (ref 98–111)
Creatinine, Ser: 0.69 mg/dL (ref 0.44–1.00)
GFR, Estimated: >60 mL/min (ref >60)
Glucose, Bld: 106 mg/dL — ABNORMAL HIGH (ref 70–99)
Potassium: 4.3 mmol/L (ref 3.5–5.1)
Sodium: 137 mmol/L (ref 135–145)
Total Bilirubin: 0.4 mg/dL (ref 0.3–1.2)
Total Protein: 6.6 g/dL (ref 6.5–8.1)

## 2022-02-01 LAB — CBC
HCT: 40.3 % (ref 36.0–46.0)
Hemoglobin: 13.2 g/dL (ref 12.0–15.0)
MCH: 29.3 pg (ref 26.0–34.0)
MCHC: 32.8 g/dL (ref 30.0–36.0)
MCV: 89.6 fL (ref 80.0–100.0)
Platelets: 190 10*3/uL (ref 150–400)
RBC: 4.5 MIL/uL (ref 3.87–5.11)
RDW: 15 % (ref 11.5–15.5)
WBC: 11.4 10*3/uL — ABNORMAL HIGH (ref 4.0–10.5)
nRBC: 0 % (ref 0.0–0.2)

## 2022-02-01 LAB — I-STAT BETA HCG BLOOD, ED (MC, WL, AP ONLY): I-stat hCG, quantitative: >2000 m[IU]/mL — ABNORMAL HIGH (ref <5)

## 2022-02-01 LAB — LIPASE, BLOOD: Lipase: 30 U/L (ref 11–51)

## 2022-02-01 MED ORDER — METOCLOPRAMIDE HCL 10 MG PO TABS
10.0000 mg | ORAL_TABLET | Freq: Once | ORAL | Status: AC
  Administered 2022-02-01: 10 mg via ORAL
  Filled 2022-02-01: qty 1

## 2022-02-01 NOTE — ED Triage Notes (Signed)
Patient said she has vomited around 10 times within the past 2 days. Having centralized upper abdominal pain. Also has a cough. Her family member is sick. Last period was 3 months ago, she was wanting a pregnancy test.

## 2022-02-01 NOTE — Discharge Instructions (Addendum)
Please follow up with your OBGYN and/or PCP for prenatal monitoring. Please take Diclegis 2 tablets nightly for nausea and vomiting. If this is not covered by your insurance or is too expensive: Go to a drugstore and buy unisom and vitaminb6(pyridoxine) Take 1/2 a tab of unisom(12.5mg ) and 25mg  of vitamin b6.  Take this at night before bed.  If you continue to have nausea and vomiting take twice a day.  [facility]

## 2022-02-01 NOTE — ED Provider Notes (Signed)
[facility]-EMERGENCY DEPT Provider Note   CSN: [id number] Arrival date & time: 02/01/22  0759     History  Chief Complaint  Patient presents with   Emesis    Cassandra Houston is a 31 y.o. female.   Emesis Vomiting for 2 months. Vomiting more and more, last 12 hrs 8-9 times. Hasn't been eating due to low appetite and worried about throwing up more. Feels like she throws up green stomach acid. Will be clear if she's had some water or fruit. Had a small streak of bright red in her vomitus yesterday after vomiting a lot. Endorses sore throat that she feels is from coughing the past few days. Coughing has also gotten worse. Endorses chills but no fever. Son is sick with cold. Denies other cold symptoms including shortness of breath. Cough for 3 days now. Son got sick 3 or 4 days ago. Hasn't been able to eat well over the last two months. Not drinking much water either. Abdominal pain in middle of stomach. Muscles get sore from coughing and vomiting. Denies diarrhea, endorses a little constipation. Typically gets nausea and vomiting when she gets pregnant. Worse in the mornings but sometimes brought on by smells. Had one soft bowel movement this morning.     Home Medications Prior to Admission medications   Medication Sig Start Date End Date Taking? Authorizing Provider  benzonatate (TESSALON) 100 MG capsule Take 1 capsule (100 mg total) by mouth every 8 (eight) hours. 12/06/20   Mannie Stabile, PA-C  cephALEXin (KEFLEX) 500 MG capsule Take 1 capsule by mouth 4 times daily. 10/26/21   Margarita Grizzle, MD  ferrous sulfate 324 MG TBEC Take 324 mg by mouth daily.    [provider]  ondansetron (ZOFRAN ODT) 4 MG disintegrating tablet Take 1 tablet (4 mg total) by mouth every 8 (eight) hours as needed for nausea or vomiting. 12/06/20   Mannie Stabile, PA-C      Allergies    Patient has no known allergies.    Review of Systems   Review of Systems   Gastrointestinal:  Positive for vomiting.    Physical Exam Updated Vital Signs BP (!) 148/98 (BP Location: Left Arm)   Pulse 91   Temp 98.2 F (36.8 C) (Oral)   Resp 18   SpO2 97%  Physical Exam Constitutional:      General: She is not in acute distress. HENT:     Head: Normocephalic and atraumatic.     Mouth/Throat:     Mouth: Mucous membranes are moist.     Pharynx: No oropharyngeal exudate or posterior oropharyngeal erythema.  Eyes:     Extraocular Movements: Extraocular movements intact.  Cardiovascular:     Rate and Rhythm: Normal rate and regular rhythm.  Pulmonary:     Effort: Pulmonary effort is normal.     Breath sounds: No wheezing, rhonchi or rales.  Abdominal:     General: Abdomen is flat. Bowel sounds are normal.     Palpations: Abdomen is soft.     Tenderness: There is abdominal tenderness. There is no guarding or rebound.  Musculoskeletal:     Right lower leg: No edema.     Left lower leg: No edema.  Skin:    General: Skin is warm and dry.  Neurological:     General: No focal deficit present.     Mental Status: She is alert and oriented to person, place, and time.  Psychiatric:  Mood and Affect: Mood normal.        Behavior: Behavior normal.     ED Results / Procedures / Treatments   Labs (all labs ordered are listed, but only abnormal results are displayed) Labs Reviewed  CBC - Abnormal; Notable for the following components:      Result Value   WBC 11.4 (*)    All other components within normal limits  URINALYSIS, ROUTINE W REFLEX MICROSCOPIC - Abnormal; Notable for the following components:   Color, Urine AMBER (*)    APPearance CLOUDY (*)    Glucose, UA 50 (*)    Ketones, ur 20 (*)    Protein, ur >=300 (*)    Leukocytes,Ua SMALL (*)    Bacteria, UA MANY (*)    All other components within normal limits  I-STAT BETA HCG BLOOD, ED ([facility], [facility], [facility] ONLY) - Abnormal; Notable for the following components:   I-stat hCG, quantitative >2,000.0  (*)    All other components within normal limits  LIPASE, BLOOD  COMPREHENSIVE METABOLIC PANEL    EKG None  Radiology No results found.  Procedures Procedures    Medications Ordered in ED Medications  metoCLOPramide (REGLAN) tablet 10 mg (10 mg Oral Given 02/01/22 7425)    ED Course/ Medical Decision Making/ A&P                           Medical Decision Making Amount and/or Complexity of Data Reviewed Labs: ordered.  Risk Prescription drug management.   Patient presents with 2 months of nausea, vomiting, non-productive cough, and sore throat. Pregnancy test positive. Feel nausea and vomiting likely from pregnancy as symptoms occur in the morning and are consistent with past pregnancies for her. Sore throat and cough possibly result of esophageal inflammation vs concurrent viral URI. UA with many bacteria though small leukocytes and negative nitrites and 11-20 squamous cells. Patient without dysuria. Feel like collection likely contaminated and will not treat. Discussed positive pregnancy test.   Patient to follow up with OBGYN. Will provide anti-emetic therapy with one dose reglan here and diclegis 2 tabs nightly.         Final Clinical Impression(s) / ED Diagnoses Final diagnoses:  None    Rx / DC Orders ED Discharge Orders     None         Adron Bene, MD 02/01/22 1009    Melene Plan, DO 02/01/22 1037    Melene Plan, DO 02/01/22 1037

## 2022-02-14 IMAGING — US US ABDOMEN LIMITED
1 series · 14 of 25 positions shown · non-contrast
Comparison: None.

CLINICAL DATA: Right upper quadrant abdominal pain.

EXAM:
ULTRASOUND ABDOMEN LIMITED RIGHT UPPER QUADRANT

[Series 1: us abdomen limited ruq (liver/gb) · 14 of 46 slices shown]
[im 1/46]
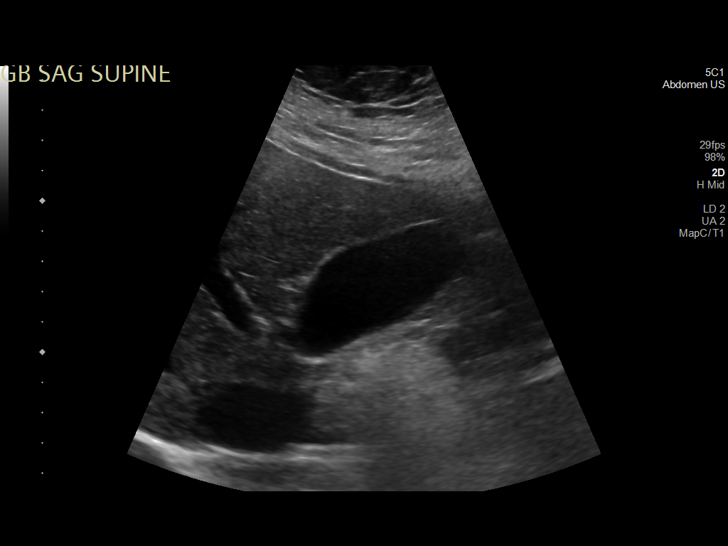
[im 4/46]
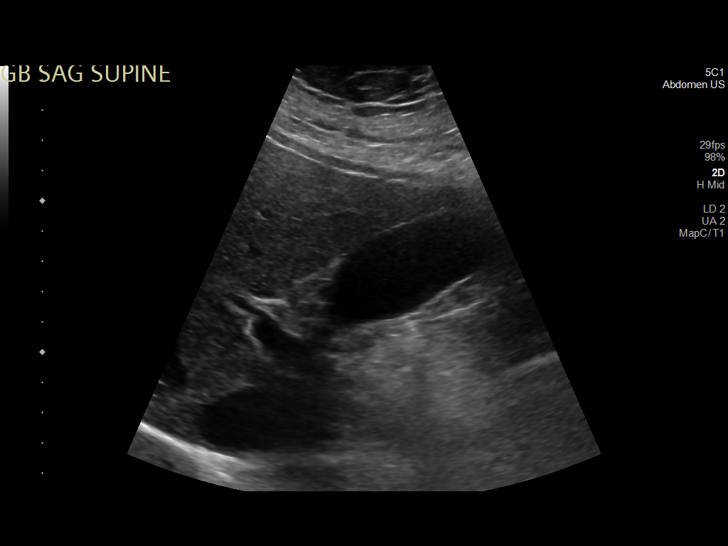
[im 8/46]
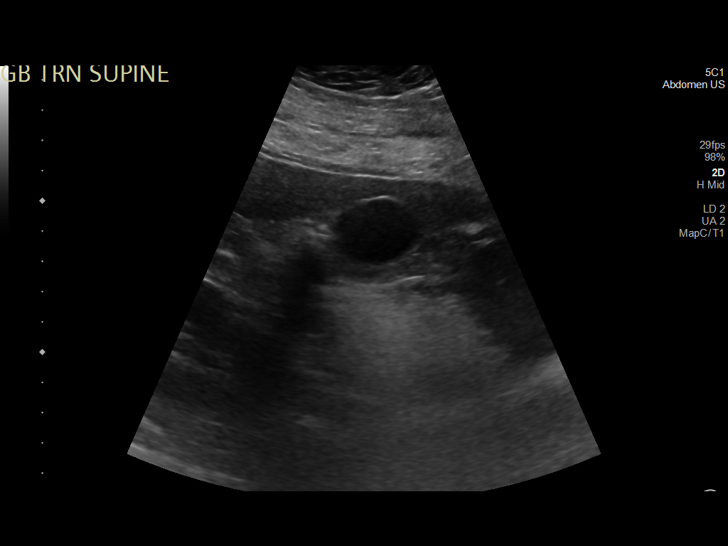
[im 12/46]
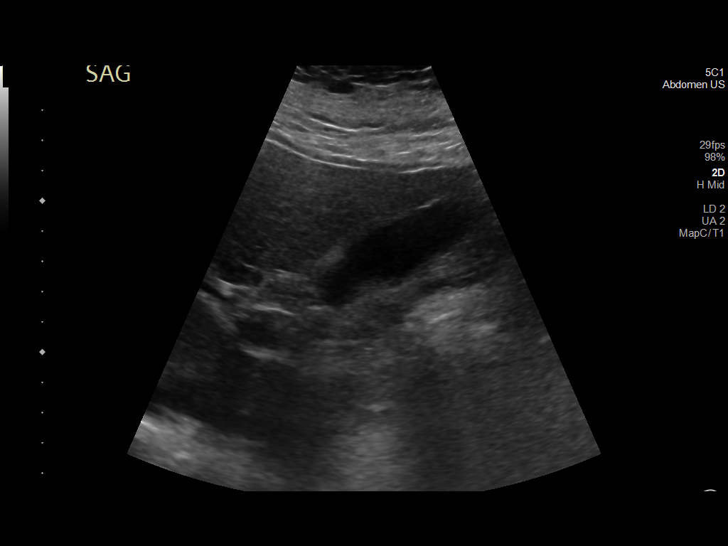
[im 16/46]
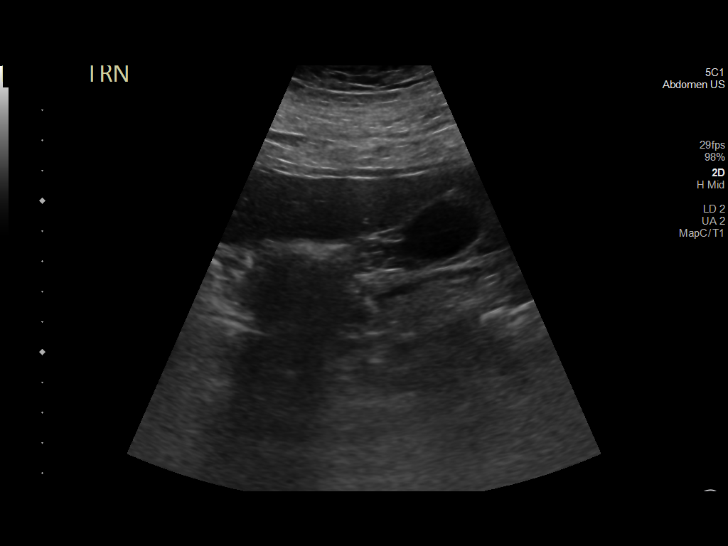
[im 17/46]
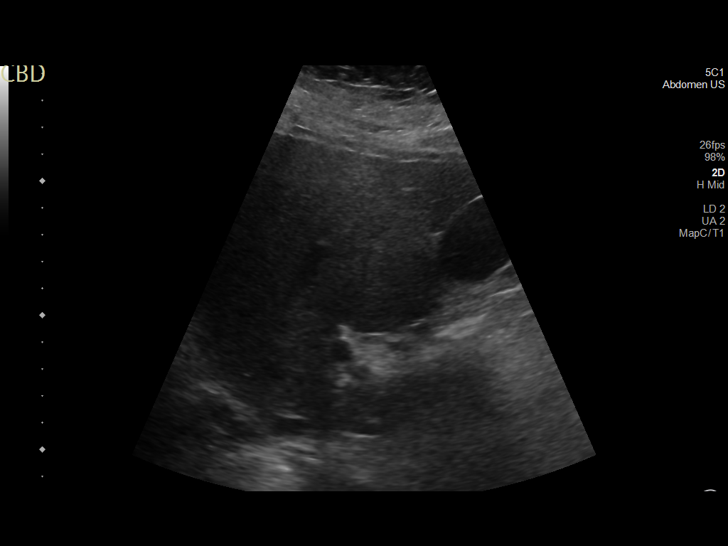
[im 21/46]
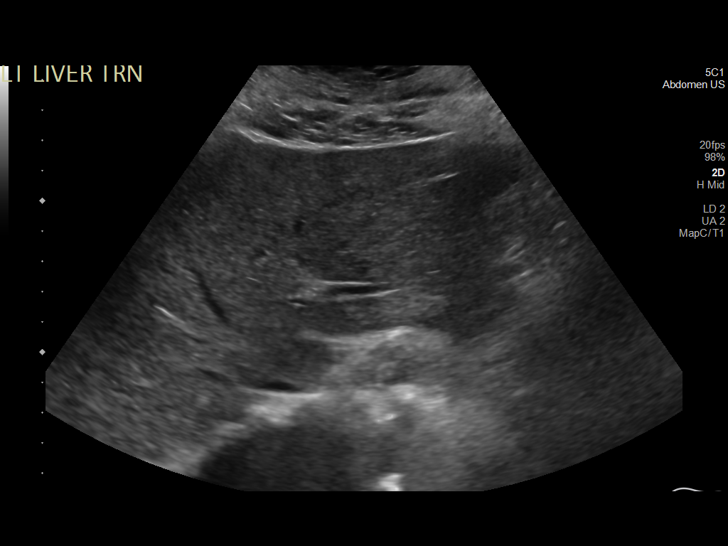
[im 25/46]
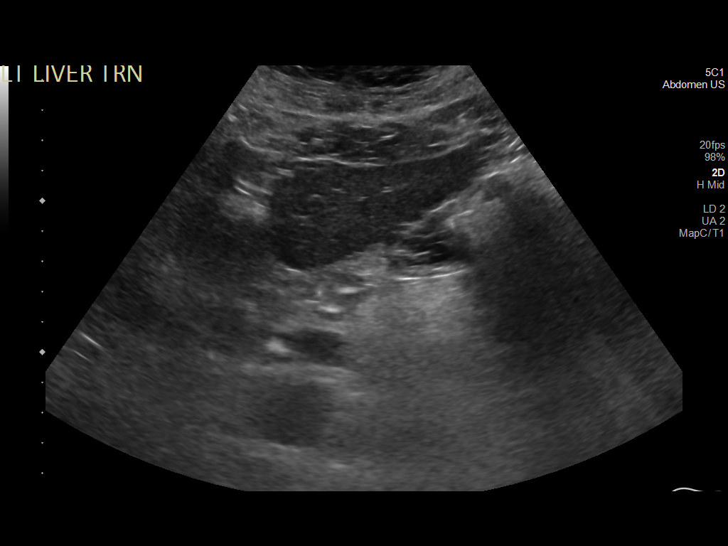
[im 29/46]
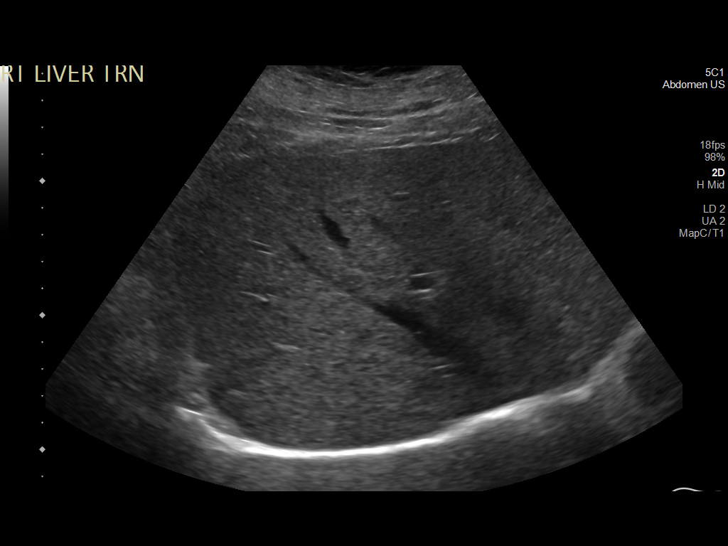
[im 31/46]
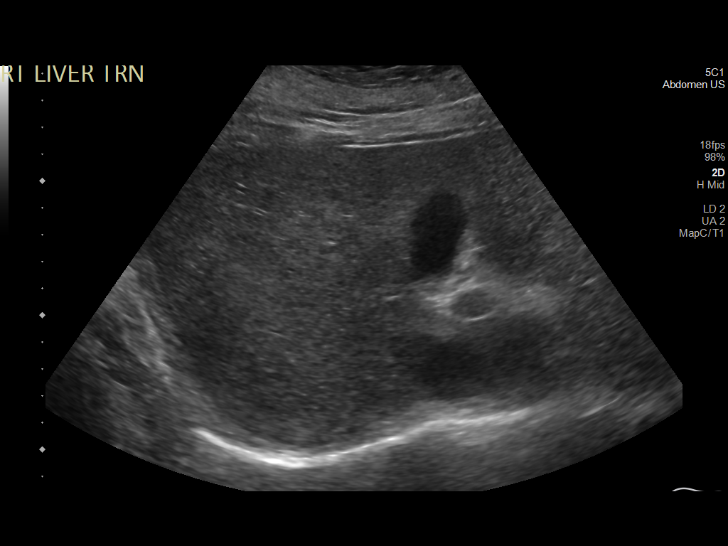
[im 34/46]
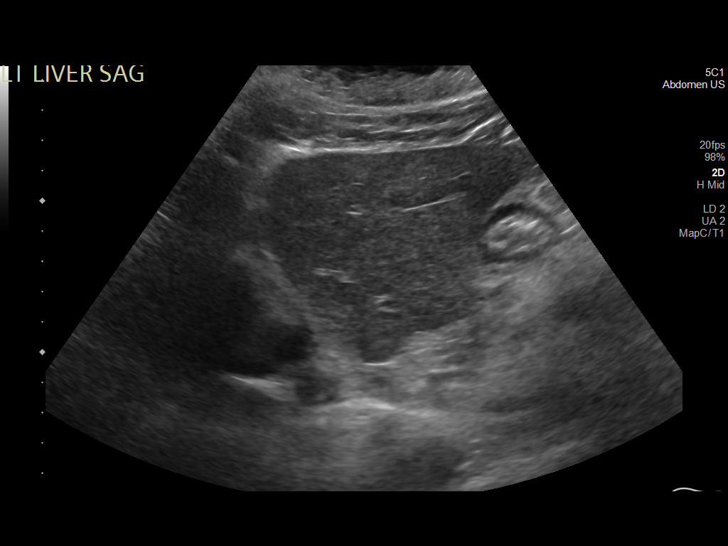
[im 38/46]
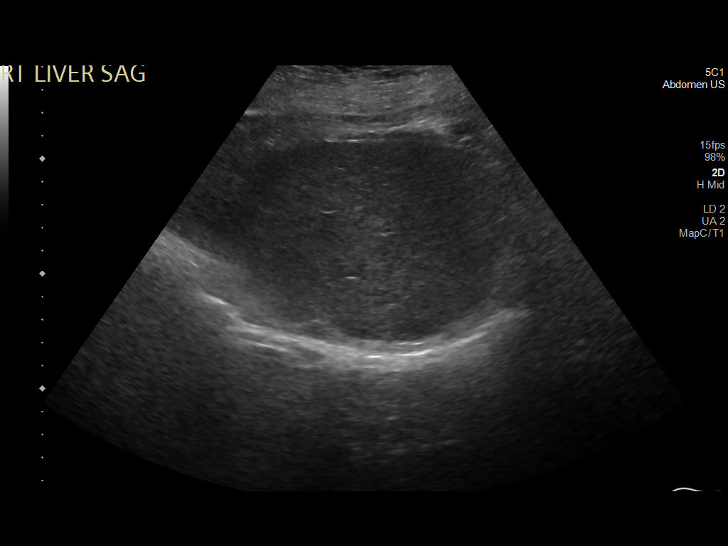
[im 42/46]
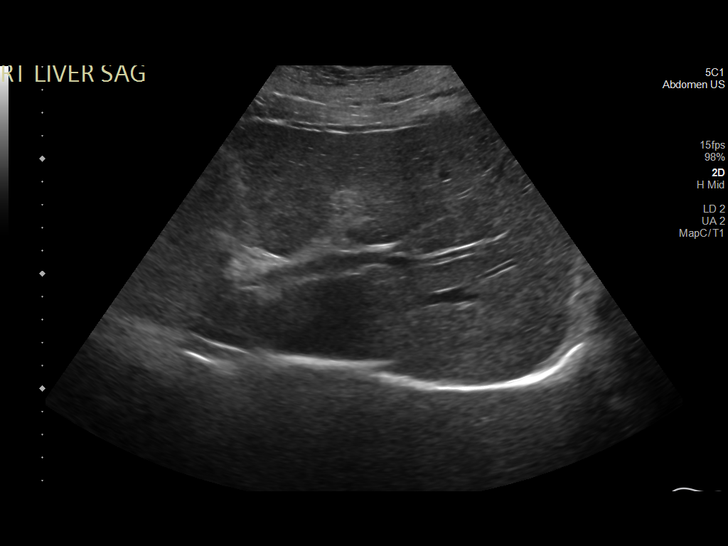
[im 46/46]
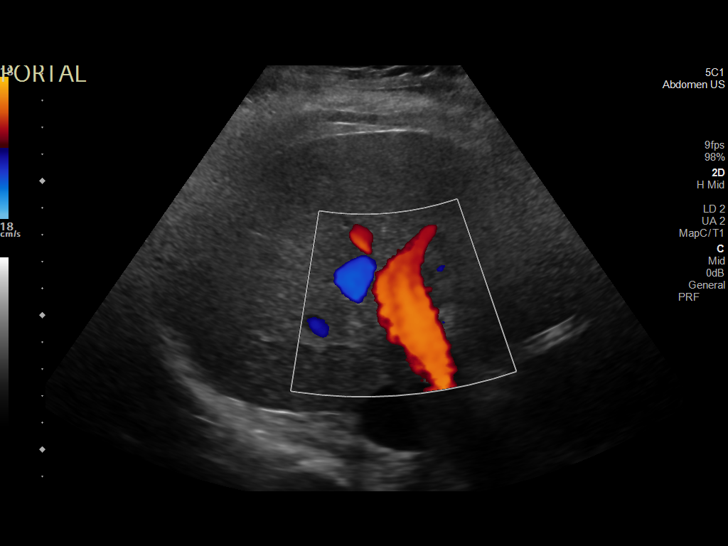

[14 of 25 positions shown; findings below may reference images not displayed]

FINDINGS: Gallbladder:

No gallstones or wall thickening visualized. No sonographic Murphy
sign noted by sonographer.

Common bile duct:

Diameter: 3 mm which is within normal limits.

Liver:

No focal lesion identified. Within normal limits in parenchymal
echogenicity. Portal vein is patent on color Doppler imaging with
normal direction of blood flow towards the liver.

Other: None.
IMPRESSION: No abnormality seen in the right upper quadrant of the abdomen.

## 2022-03-02 ENCOUNTER — Emergency Department (HOSPITAL_COMMUNITY)
Admission: EM | Admit: 2022-03-02 | Discharge: 2022-03-02 | Discharge status: Against Medical Advice | Payer: Self-pay | Attending: Emergency Medicine | Admitting: Emergency Medicine

## 2022-03-02 DIAGNOSIS — O219 Vomiting of pregnancy, unspecified: Secondary | ICD-10-CM | POA: Insufficient documentation

## 2022-03-02 DIAGNOSIS — Z5321 Procedure and treatment not carried out due to patient leaving prior to being seen by health care provider: Secondary | ICD-10-CM | POA: Insufficient documentation

## 2022-03-02 DIAGNOSIS — Z3A00 Weeks of gestation of pregnancy not specified: Secondary | ICD-10-CM | POA: Insufficient documentation

## 2022-03-02 NOTE — ED Provider Triage Note (Signed)
Emergency Medicine Provider Triage Evaluation Note  Cassandra Houston , a 31 y.o. female  was evaluated in triage.  Pt complains of n/v . She threw up blood. She is pregnant. She is taking her regular n/v meds  Review of Systems  Positive: vomiting Negative: fever  Physical Exam  BP 125/72 (BP Location: Right Arm)   Pulse 65   Temp 98 F (36.7 C) (Oral)   Resp 19   SpO2 99%  Gen:   Awake, no distress   Resp:  Normal effort  MSK:   Moves extremities without difficulty  Other:    Medical Decision Making  Medically screening exam initiated at 10:09 PM.  Appropriate orders placed.  Cassandra Houston was informed that the remainder of the evaluation will be completed by another provider, this initial triage assessment does not replace that evaluation, and the importance of remaining in the ED until their evaluation is complete.     Arthor Captain, PA-C 03/02/22 2218

## 2022-03-03 ENCOUNTER — Emergency Department (HOSPITAL_COMMUNITY)
Admission: EM | Admit: 2022-03-03 | Discharge: 2022-03-03 | Disposition: A | Discharge status: Home / Self Care | Payer: Self-pay | Attending: Emergency Medicine | Admitting: Emergency Medicine

## 2022-03-03 ENCOUNTER — Other Ambulatory Visit: Payer: Self-pay

## 2022-03-03 DIAGNOSIS — Z3A18 18 weeks gestation of pregnancy: Secondary | ICD-10-CM | POA: Insufficient documentation

## 2022-03-03 DIAGNOSIS — O219 Vomiting of pregnancy, unspecified: Secondary | ICD-10-CM | POA: Insufficient documentation

## 2022-03-03 DIAGNOSIS — E876 Hypokalemia: Secondary | ICD-10-CM | POA: Insufficient documentation

## 2022-03-03 DIAGNOSIS — Z1152 Encounter for screening for COVID-19: Secondary | ICD-10-CM | POA: Insufficient documentation

## 2022-03-03 DIAGNOSIS — O99282 Endocrine, nutritional and metabolic diseases complicating pregnancy, second trimester: Secondary | ICD-10-CM | POA: Insufficient documentation

## 2022-03-03 LAB — COMPREHENSIVE METABOLIC PANEL
ALT: 19 U/L (ref 0–44)
AST: 19 U/L (ref 15–41)
Albumin: 3.7 g/dL (ref 3.5–5.0)
Alkaline Phosphatase: 54 U/L (ref 38–126)
Anion gap: 8 (ref 5–15)
BUN: 10 mg/dL (ref 6–20)
CO2: 25 mmol/L (ref 22–32)
Calcium: 9.2 mg/dL (ref 8.9–10.3)
Chloride: 105 mmol/L (ref 98–111)
Creatinine, Ser: 0.65 mg/dL (ref 0.44–1.00)
GFR, Estimated: >60 mL/min (ref >60)
Glucose, Bld: 99 mg/dL (ref 70–99)
Potassium: 3 mmol/L — ABNORMAL LOW (ref 3.5–5.1)
Sodium: 138 mmol/L (ref 135–145)
Total Bilirubin: 0.6 mg/dL (ref 0.3–1.2)
Total Protein: 7.7 g/dL (ref 6.5–8.1)

## 2022-03-03 LAB — RESP PANEL BY RT-PCR (RSV, FLU A&B, COVID)  RVPGX2
Influenza A by PCR: NEGATIVE
Influenza B by PCR: NEGATIVE
Resp Syncytial Virus by PCR: NEGATIVE
SARS Coronavirus 2 by RT PCR: NEGATIVE

## 2022-03-03 LAB — CBC WITH DIFFERENTIAL/PLATELET
Abs Immature Granulocytes: 0.06 10*3/uL (ref 0.00–0.07)
Basophils Absolute: 0 10*3/uL (ref 0.0–0.1)
Basophils Relative: 0 %
Eosinophils Absolute: 0 10*3/uL (ref 0.0–0.5)
Eosinophils Relative: 0 %
HCT: 37.5 % (ref 36.0–46.0)
Hemoglobin: 12.5 g/dL (ref 12.0–15.0)
Immature Granulocytes: 1 %
Lymphocytes Relative: 14 %
Lymphs Abs: 1.8 10*3/uL (ref 0.7–4.0)
MCH: 29.6 pg (ref 26.0–34.0)
MCHC: 33.3 g/dL (ref 30.0–36.0)
MCV: 88.9 fL (ref 80.0–100.0)
Monocytes Absolute: 0.8 10*3/uL (ref 0.1–1.0)
Monocytes Relative: 6 %
Neutro Abs: 10.2 10*3/uL — ABNORMAL HIGH (ref 1.7–7.7)
Neutrophils Relative %: 79 %
Platelets: 311 10*3/uL (ref 150–400)
RBC: 4.22 MIL/uL (ref 3.87–5.11)
RDW: 15.3 % (ref 11.5–15.5)
WBC: 12.9 10*3/uL — ABNORMAL HIGH (ref 4.0–10.5)
nRBC: 0 % (ref 0.0–0.2)

## 2022-03-03 LAB — LIPASE, BLOOD: Lipase: 27 U/L (ref 11–51)

## 2022-03-03 LAB — URINALYSIS, ROUTINE W REFLEX MICROSCOPIC
Bilirubin Urine: NEGATIVE
Glucose, UA: NEGATIVE mg/dL
Hgb urine dipstick: NEGATIVE
Ketones, ur: 20 mg/dL — AB
Leukocytes,Ua: NEGATIVE
Nitrite: NEGATIVE
Protein, ur: 100 mg/dL — AB
Specific Gravity, Urine: 1.026 (ref 1.005–1.030)
pH: 5 (ref 5.0–8.0)

## 2022-03-03 MED ORDER — SODIUM CHLORIDE 0.9 % IV BOLUS
1000.0000 mL | Freq: Once | INTRAVENOUS | Status: AC
  Administered 2022-03-03: 1000 mL via INTRAVENOUS

## 2022-03-03 MED ORDER — ONDANSETRON HCL 4 MG/2ML IJ SOLN: 4.0000 mg | Freq: Once | INTRAMUSCULAR | Status: DC

## 2022-03-03 MED ORDER — METOCLOPRAMIDE HCL 5 MG/ML IJ SOLN
5.0000 mg | Freq: Once | INTRAMUSCULAR | Status: AC
  Administered 2022-03-03: 5 mg via INTRAVENOUS
  Filled 2022-03-03: qty 2

## 2022-03-03 MED ORDER — POTASSIUM CHLORIDE CRYS ER 20 MEQ PO TBCR
40.0000 meq | EXTENDED_RELEASE_TABLET | Freq: Once | ORAL | Status: AC
  Administered 2022-03-03: 40 meq via ORAL
  Filled 2022-03-03: qty 2

## 2022-03-03 NOTE — ED Triage Notes (Signed)
Pt via POV c/o pregnancy-related n/v, unable to hold food and liquid down since Christmas morning. Pt is [redacted] weeks pregnant with LMP in Aug 2023. G1 pregnancy with no established prenatal care. Unsure how many times she has vomited but states there has been some blood in her emesis for a couple of days. Pt is taking prenatal vitamins and hopes to schedule an OB visit next week. Counseling provided.

## 2022-03-03 NOTE — ED Provider Triage Note (Signed)
Emergency Medicine Provider Triage Evaluation Note  Cassandra Houston , a 31 y.o. female  was evaluated in triage.  Pt complains of throwing up blood.  She states she recently got over a URI.  She states she has been mostly throwing up in the morning however since Christmas Day has been constant.  Endorses decreased p.o. intake.  Also found out end of November when she was seen in the ED she was pregnant.  States she has been taking an antiemetic without significant improvement in symptoms.  She states past several days her vomit has been red and frothy.  Denies any vaginal bleeding, dysuria, vaginal discharge.   Review of Systems  Positive: As above Negative: As above  Physical Exam  BP (!) 143/76 (BP Location: Left Arm)   Pulse 61   Temp 98.3 F (36.8 C) (Oral)   Resp 16   SpO2 95%  Gen:   Awake, no distress   Resp:  Normal effort  MSK:   Moves extremities without difficulty  Other:    Medical Decision Making  Medically screening exam initiated at 4:00 PM.  Appropriate orders placed.  Cassandra Houston was informed that the remainder of the evaluation will be completed by another provider, this initial triage assessment does not replace that evaluation, and the importance of remaining in the ED until their evaluation is complete.    Marita Kansas, PA-C 03/03/22 [phone]

## 2022-03-03 NOTE — Discharge Instructions (Signed)
It was a pleasure taking care of you today.  As discussed, all of your labs are reassuring.  Your potassium was slightly low.  Please have your potassium rechecked in 1 week.  Report to your OB/GYN appointment next Friday.  Call office if you continue to have nausea vomiting. Take Diclegis as previously prescribed. Return to the ED for new or worsening symptoms.

## 2022-03-03 NOTE — ED Provider Notes (Signed)
[facility]-EMERGENCY DEPT Provider Note   CSN: [id number] Arrival date & time: 03/03/22  1258     History  Chief Complaint  Patient presents with   Emesis During Pregnancy    Cassandra Houston is a 31 y.o. female with no significant past medical history who presents to the ED due to nausea and vomiting x 3 days. Admits to some streaks of blood in emesis. No blood clots. Denies chest pain and shortness of breath. Denies abdominal pain.  Patient is currently [redacted]w[redacted]d gestation.  G1P0.  History of nausea and vomiting during entire pregnancy.  She has been taking diclegis with no relief.  Patient has been following pregnancy network and has had an ultrasound to confirm IUP per patient.  She has not establish care with an OB due to insurance issues however, has an appointment scheduled next Friday.  Denies vaginal bleeding or fluid from the vagina.  No urinary symptoms.  Patient does have a fever yesterday.  History obtained from patient and past medical records. No interpreter used during encounter.       Home Medications Prior to Admission medications   Medication Sig Start Date End Date Taking? Authorizing Provider  benzonatate (TESSALON) 100 MG capsule Take 1 capsule (100 mg total) by mouth every 8 (eight) hours. 12/06/20   Mannie Stabile, PA-C  cephALEXin (KEFLEX) 500 MG capsule Take 1 capsule by mouth 4 times daily. 10/26/21   Margarita Grizzle, MD  ferrous sulfate 324 MG TBEC Take 324 mg by mouth daily.    [provider]  ondansetron (ZOFRAN ODT) 4 MG disintegrating tablet Take 1 tablet (4 mg total) by mouth every 8 (eight) hours as needed for nausea or vomiting. 12/06/20   Mannie Stabile, PA-C      Allergies    Patient has no known allergies.    Review of Systems   Review of Systems  Constitutional:  Positive for chills and fever.  Respiratory:  Negative for shortness of breath.   Cardiovascular:  Negative for chest pain.  Gastrointestinal:   Positive for nausea and vomiting. Negative for abdominal pain and diarrhea.  Genitourinary:  Negative for dysuria.  All other systems reviewed and are negative.   Physical Exam Updated Vital Signs BP (!) 143/76 (BP Location: Left Arm)   Pulse 61   Temp 98.3 F (36.8 C) (Oral)   Resp 16   Ht 5\' 4"  (1.626 m)   Wt 122.5 kg   SpO2 95%   BMI 46.35 kg/m  Physical Exam Vitals and nursing note reviewed.  Constitutional:      General: She is not in acute distress.    Appearance: She is not ill-appearing.  HENT:     Head: Normocephalic.  Eyes:     Pupils: Pupils are equal, round, and reactive to light.  Cardiovascular:     Rate and Rhythm: Normal rate and regular rhythm.     Pulses: Normal pulses.     Heart sounds: Normal heart sounds. No murmur heard.    No friction rub. No gallop.  Pulmonary:     Effort: Pulmonary effort is normal.     Breath sounds: Normal breath sounds.  Abdominal:     General: Abdomen is flat. There is no distension.     Palpations: Abdomen is soft.     Tenderness: There is no abdominal tenderness. There is no guarding or rebound.     Comments: Abdomen soft, nondistended, nontender to palpation in all quadrants without guarding or peritoneal  signs. No rebound.   Musculoskeletal:        General: Normal range of motion.     Cervical back: Neck supple.  Skin:    General: Skin is warm and dry.  Neurological:     General: No focal deficit present.     Mental Status: She is alert.  Psychiatric:        Mood and Affect: Mood normal.        Behavior: Behavior normal.     ED Results / Procedures / Treatments   Labs (all labs ordered are listed, but only abnormal results are displayed) Labs Reviewed  CBC WITH DIFFERENTIAL/PLATELET - Abnormal; Notable for the following components:      Result Value   WBC 12.9 (*)    Neutro Abs 10.2 (*)    All other components within normal limits  COMPREHENSIVE METABOLIC PANEL - Abnormal; Notable for the following  components:   Potassium 3.0 (*)    All other components within normal limits  URINALYSIS, ROUTINE W REFLEX MICROSCOPIC - Abnormal; Notable for the following components:   Color, Urine AMBER (*)    APPearance HAZY (*)    Ketones, ur 20 (*)    Protein, ur 100 (*)    Bacteria, UA RARE (*)    All other components within normal limits  RESP PANEL BY RT-PCR (RSV, FLU A&B, COVID)  RVPGX2  URINE CULTURE  LIPASE, BLOOD    EKG None  Radiology No results found.  Procedures Procedures    Medications Ordered in ED Medications  sodium chloride 0.9 % bolus 1,000 mL (0 mLs Intravenous Stopped 03/03/22 2026)  metoCLOPramide (REGLAN) injection 5 mg (5 mg Intravenous Given 03/03/22 1916)  potassium chloride SA (KLOR-CON M) CR tablet 40 mEq (40 mEq Oral Given 03/03/22 2025)    ED Course/ Medical Decision Making/ A&P                           Medical Decision Making Amount and/or Complexity of Data Reviewed Labs: ordered. Decision-making details documented in ED Course.  Risk Prescription drug management.   This patient presents to the ED for concern of nausea and vomiting, this involves an extensive number of treatment options, and is a complaint that carries with it a high risk of complications and morbidity.  The differential diagnosis includes vomiting in pregnancy, influenza, COVID, etc  31 year old female presents to the ED due to nausea and vomiting x 3 days.  Patient is currently [redacted]w[redacted]d gestation.  She admits to streaks of blood in her emesis.  She has been taking Diclegis just with no relief. G1P0. Has not established care with an OB yet due to insurance issues; however has a scheduled appointment next Friday. She has had an [place] to confirm IUP with pregnancy network per patient.  No medical conditions. Upon arrival, patient afebrile, not tachycardic or hypoxic.  Patient in no acute distress.  Abdomen soft, nondistended, nontender.  Patient denies vaginal bleeding or fluid from the  vagina.  Low suspicion for ectopic pregnancy given patient states she has had an ultrasound to confirm IUP.  No OB complaints to suggest pregnancy complication, do not feel [place] is needed at this point. Routine labs ordered.  IV fluids and Reglan given.  COVID/influenza negative.  CBC significant for leukocytosis of 12.9.  Normal hemoglobin.  CMP significant for hypokalemia.  Potassium repleted here in the ED.  Normal renal function.  Lipase normal at 27.  Doubt pancreatitis.  UA significant for rare bacteria.  No leukocytes. Will hold on antibiotics due to rare bacteria no urinary symptoms.  Urine culture pending.  Reassessed patient at bedside.  Patient admits to resolution symptoms.  Patient able to tolerate p.o. at bedside.  Patient advised to follow-up with OB/GYN for further evaluation at her scheduled appointment next Friday.  Patient still has diclegis and was advised to take as needed for nausea and vomiting. Strict ED precautions discussed with patient. Patient states understanding and agrees to plan. Patient discharged home in no acute distress and stable vitals  No PCP       Final Clinical Impression(s) / ED Diagnoses Final diagnoses:  Nausea and vomiting in pregnancy  Hypokalemia    Rx / DC Orders ED Discharge Orders     None         Jesusita Oka 03/03/22 2053    Melene Plan, DO 03/03/22 2241

## 2022-03-04 LAB — URINE CULTURE: Culture: NO GROWTH

## 2022-05-09 ENCOUNTER — Encounter (HOSPITAL_COMMUNITY): Payer: Self-pay

## 2022-05-09 ENCOUNTER — Emergency Department (HOSPITAL_COMMUNITY)
Admission: EM | Admit: 2022-05-09 | Discharge: 2022-05-09 | Disposition: A | Discharge status: Home / Self Care | Payer: Medicaid Other | Attending: Emergency Medicine | Admitting: Emergency Medicine

## 2022-05-09 ENCOUNTER — Other Ambulatory Visit: Payer: Self-pay

## 2022-05-09 DIAGNOSIS — Z3A29 29 weeks gestation of pregnancy: Secondary | ICD-10-CM | POA: Insufficient documentation

## 2022-05-09 DIAGNOSIS — Z20822 Contact with and (suspected) exposure to covid-19: Secondary | ICD-10-CM | POA: Diagnosis not present

## 2022-05-09 DIAGNOSIS — O219 Vomiting of pregnancy, unspecified: Secondary | ICD-10-CM | POA: Diagnosis not present

## 2022-05-09 LAB — COMPREHENSIVE METABOLIC PANEL
ALT: 25 U/L (ref 0–44)
AST: 19 U/L (ref 15–41)
Albumin: 3.2 g/dL — ABNORMAL LOW (ref 3.5–5.0)
Alkaline Phosphatase: 69 U/L (ref 38–126)
Anion gap: 11 (ref 5–15)
BUN: 10 mg/dL (ref 6–20)
CO2: 18 mmol/L — ABNORMAL LOW (ref 22–32)
Calcium: 8.7 mg/dL — ABNORMAL LOW (ref 8.9–10.3)
Chloride: 106 mmol/L (ref 98–111)
Creatinine, Ser: 0.55 mg/dL (ref 0.44–1.00)
GFR, Estimated: >60 mL/min (ref >60)
Glucose, Bld: 108 mg/dL — ABNORMAL HIGH (ref 70–99)
Potassium: 3.3 mmol/L — ABNORMAL LOW (ref 3.5–5.1)
Sodium: 135 mmol/L (ref 135–145)
Total Bilirubin: 0.7 mg/dL (ref 0.3–1.2)
Total Protein: 7.3 g/dL (ref 6.5–8.1)

## 2022-05-09 LAB — URINALYSIS, ROUTINE W REFLEX MICROSCOPIC
Bilirubin Urine: NEGATIVE
Glucose, UA: NEGATIVE mg/dL
Hgb urine dipstick: NEGATIVE
Ketones, ur: 20 mg/dL — AB
Leukocytes,Ua: NEGATIVE
Nitrite: NEGATIVE
Protein, ur: 100 mg/dL — AB
Specific Gravity, Urine: 1.026 (ref 1.005–1.030)
pH: 6 (ref 5.0–8.0)

## 2022-05-09 LAB — CBC
HCT: 35.4 % — ABNORMAL LOW (ref 36.0–46.0)
Hemoglobin: 12.2 g/dL (ref 12.0–15.0)
MCH: 31 pg (ref 26.0–34.0)
MCHC: 34.5 g/dL (ref 30.0–36.0)
MCV: 89.8 fL (ref 80.0–100.0)
Platelets: 359 10*3/uL (ref 150–400)
RBC: 3.94 MIL/uL (ref 3.87–5.11)
RDW: 13.2 % (ref 11.5–15.5)
WBC: 11.6 10*3/uL — ABNORMAL HIGH (ref 4.0–10.5)
nRBC: 0 % (ref 0.0–0.2)

## 2022-05-09 LAB — I-STAT CHEM 8, ED
BUN: 7 mg/dL (ref 6–20)
Calcium, Ion: 1.02 mmol/L — ABNORMAL LOW (ref 1.15–1.40)
Chloride: 104 mmol/L (ref 98–111)
Creatinine, Ser: 0.4 mg/dL — ABNORMAL LOW (ref 0.44–1.00)
Glucose, Bld: 96 mg/dL (ref 70–99)
HCT: 34 % — ABNORMAL LOW (ref 36.0–46.0)
Hemoglobin: 11.6 g/dL — ABNORMAL LOW (ref 12.0–15.0)
Potassium: 3.9 mmol/L (ref 3.5–5.1)
Sodium: 138 mmol/L (ref 135–145)
TCO2: 23 mmol/L (ref 22–32)

## 2022-05-09 LAB — PROTEIN / CREATININE RATIO, URINE
Creatinine, Urine: 340 mg/dL
Protein Creatinine Ratio: 0.15 mg/mg{Cre} (ref 0.00–0.15)
Total Protein, Urine: 50 mg/dL

## 2022-05-09 LAB — I-STAT BETA HCG BLOOD, ED (MC, WL, AP ONLY): I-stat hCG, quantitative: >2000 m[IU]/mL — ABNORMAL HIGH (ref <5)

## 2022-05-09 LAB — RESP PANEL BY RT-PCR (RSV, FLU A&B, COVID)  RVPGX2
Influenza A by PCR: NEGATIVE
Influenza B by PCR: NEGATIVE
Resp Syncytial Virus by PCR: NEGATIVE
SARS Coronavirus 2 by RT PCR: NEGATIVE

## 2022-05-09 LAB — LIPASE, BLOOD: Lipase: 31 U/L (ref 11–51)

## 2022-05-09 MED ORDER — ONDANSETRON HCL 4 MG PO TABS: 4.0000 mg | ORAL_TABLET | Freq: Four times a day (QID) | ORAL | 0 refills | Status: AC

## 2022-05-09 MED ORDER — SODIUM CHLORIDE 0.9 % IV BOLUS
1000.0000 mL | Freq: Once | INTRAVENOUS | Status: AC
  Administered 2022-05-09: 1000 mL via INTRAVENOUS

## 2022-05-09 MED ORDER — ONDANSETRON HCL 4 MG/2ML IJ SOLN
4.0000 mg | Freq: Once | INTRAMUSCULAR | Status: AC
  Administered 2022-05-09: 4 mg via INTRAVENOUS
  Filled 2022-05-09: qty 2

## 2022-05-09 NOTE — ED Notes (Signed)
Pt having severe nausea w/emesis. Emesis is bilious.

## 2022-05-09 NOTE — ED Provider Notes (Signed)
[facility] Provider Note   CSN: [id number] Arrival date & time: 05/09/22  1021     History  Chief Complaint  Patient presents with   Emesis During Pregnancy    Cassandra Houston is a 32 y.o. female [redacted] wks pregnant G1P0 with emesis that began 3 days ago.  Patient states she was unable to eat or take in fluids 3 days ago however yesterday states symptoms were getting better and she was able to eat and drink but this morning symptoms began to worsen again. Patient states she is not having complications with this pregnancy.  Patient denied any hematemesis.  Patient states she ran out of Zofran a week ago and has been able to see her OB.  Patient chest pain, shortness of breath, abdominal pain, dysuria, seizures, high blood pressure during this pregnancy, vaginal bleeding/discharge, syncope or fever  Home Medications Prior to Admission medications   Medication Sig Start Date End Date Taking? Authorizing Provider  ondansetron (ZOFRAN) 4 MG tablet Take 1 tablet (4 mg total) by mouth every 6 (six) hours. 05/09/22  Yes Cadel Stairs, Florene Route, PA-C  benzonatate (TESSALON) 100 MG capsule Take 1 capsule (100 mg total) by mouth every 8 (eight) hours. 12/06/20   Suzy Bouchard, PA-C  cephALEXin (KEFLEX) 500 MG capsule Take 1 capsule by mouth 4 times daily. 10/26/21   Pattricia Boss, MD  ferrous sulfate 324 MG TBEC Take 324 mg by mouth daily.    [provider]  ondansetron (ZOFRAN ODT) 4 MG disintegrating tablet Take 1 tablet (4 mg total) by mouth every 8 (eight) hours as needed for nausea or vomiting. 12/06/20   Suzy Bouchard, PA-C      Allergies    Patient has no known allergies.    Review of Systems   Review of Systems See HPI Physical Exam Updated Vital Signs BP 139/88   Pulse 71   Temp 97.9 F (36.6 C) (Oral)   Resp (!) 22   Ht '5\' 4"'$  (1.626 m)   Wt 123.8 kg   SpO2 97%   BMI 46.86 kg/m  Physical Exam Vitals and nursing note  reviewed.  Constitutional:      General: She is not in acute distress.    Appearance: She is well-developed.     Comments: Actively having bilious emesis  HENT:     Head: Normocephalic and atraumatic.  Eyes:     Extraocular Movements: Extraocular movements intact.     Conjunctiva/sclera: Conjunctivae normal.     Pupils: Pupils are equal, round, and reactive to light.  Cardiovascular:     Rate and Rhythm: Normal rate and regular rhythm.     Pulses: Normal pulses.     Heart sounds: No murmur heard.    Comments: 2+ bilateral radial pulses with regular rate Pulmonary:     Effort: Pulmonary effort is normal. No respiratory distress.     Breath sounds: Normal breath sounds.  Abdominal:     Palpations: Abdomen is soft.     Tenderness: There is no abdominal tenderness.  Musculoskeletal:        General: No swelling. Normal range of motion.     Cervical back: Neck supple.     Comments: 5 out of 5 bilateral grip strength  Skin:    General: Skin is warm and dry.     Capillary Refill: Capillary refill takes less than 2 seconds.  Neurological:     General: No focal deficit present.  Mental Status: She is alert and oriented to person, place, and time.     Comments: Sensation intact in all 4 limbs  Psychiatric:        Mood and Affect: Mood normal.     ED Results / Procedures / Treatments   Labs (all labs ordered are listed, but only abnormal results are displayed) Labs Reviewed  COMPREHENSIVE METABOLIC PANEL - Abnormal; Notable for the following components:      Result Value   Potassium 3.3 (*)    CO2 18 (*)    Glucose, Bld 108 (*)    Calcium 8.7 (*)    Albumin 3.2 (*)    All other components within normal limits  CBC - Abnormal; Notable for the following components:   WBC 11.6 (*)    HCT 35.4 (*)    All other components within normal limits  URINALYSIS, ROUTINE W REFLEX MICROSCOPIC - Abnormal; Notable for the following components:   Color, Urine AMBER (*)    APPearance  HAZY (*)    Ketones, ur 20 (*)    Protein, ur 100 (*)    Bacteria, UA RARE (*)    All other components within normal limits  I-STAT BETA HCG BLOOD, ED ([facility], [facility], [facility] ONLY) - Abnormal; Notable for the following components:   I-stat hCG, quantitative >2,000.0 (*)    All other components within normal limits  I-STAT CHEM 8, ED - Abnormal; Notable for the following components:   Creatinine, Ser 0.40 (*)    Calcium, Ion 1.02 (*)    Hemoglobin 11.6 (*)    HCT 34.0 (*)    All other components within normal limits  RESP PANEL BY RT-PCR (RSV, FLU A&B, COVID)  RVPGX2  LIPASE, BLOOD  PROTEIN / CREATININE RATIO, URINE    EKG None  Radiology No results found.  Procedures Procedures    Medications Ordered in ED Medications  sodium chloride 0.9 % bolus 1,000 mL (0 mLs Intravenous Stopped 05/09/22 1407)  ondansetron (ZOFRAN) injection 4 mg (4 mg Intravenous Given 05/09/22 1050)  sodium chloride 0.9 % bolus 1,000 mL (1,000 mLs Intravenous New Bag/Given 05/09/22 1407)  ondansetron (ZOFRAN) injection 4 mg (4 mg Intravenous Given 05/09/22 1410)    ED Course/ Medical Decision Making/ A&P                             Medical Decision Making Amount and/or Complexity of Data Reviewed Labs: ordered.  Risk Prescription drug management.   Cassandra Houston 32 y.o. presented today for emesis in the setting of pregnancy. Working DDx that I considered at this time includes, but not limited to, preeclampsia/eclampsia, HELLP syndrome, URI, pancreatitis, UTI.  Review of prior external notes: 03/03/2022 ED  Unique Tests and My Interpretation:  History panel: Negative Lipase: Unremarkable CBC: Unremarkable I-STAT beta-hCG: Greater than 2000 CMP: Unremarkable UA: Proteinuria with rare bacteria  Discussion with Independent Historian: none  Discussion of Management of Tests: Odella Aquas, MD OB  Risk: Medium: Prescription management  Risk Stratification Score: none  Staffed with Pattricia Boss,  MD  R/o DDx: HELLP syndrome: No signs of hemolysis or elevated liver enzymes or low platelets Eclampsia: Patient is not having any seizures URI: Respiratory panel negative  Plan: Patient presented for emesis in the setting of pregnancy for the past 3 days.  On exam patient was having bilious emesis but did not appear to be in any distress.  Patient's blood pressure did appear to be  elevated 147/102 and I had the nurse repeat and blood pressure was 147/86.  Patient states she has not had any complications in this pregnancy and does not have history of hypertension.  Patient's UA was also notable for proteinuria.  Patient's OB is Dr. Hilda Lias and after speaking with Rachel Bo the receptionist was told I will have a phone call from Dr. Micah Noel in 20 minutes to see if there is concern for preeclampsia.Call was placed at 1206.  At that time patient was given fluids and Zofran which appears to have helped relieve the nausea and vomiting.  Patient stable at this time.  After talking with Dr. Posey Pronto from Dr. Cyndie Chime office, he recommended getting a protein/creatinine ratio and that the ratio was around 300 the patient to be discharged with outpatient follow-up.  If ratio was above 500 to call him back.  Protein creatinine ratio came back it 340.  Following Dr. Serita Grit recommendation patient be discharged with outpatient follow-up.  On lab recheck, patient's CO2 was decreased to 18 which could be from dry heaving however patient will be given another bolus and an I-stat chem 8 was ordered to recheck CO2.  Patient also began experiencing emesis again and so Zofran will be reordered.  On recheck patient was no longer nauseous or having episodes of emesis.  At this time patient stable for discharge with OB follow-up.  I spoke with her about seeing her OB tomorrow regarding recent symptoms and that if symptoms worsen to please go to the MAU at [facility].  I have attached this information in her discharge  papers as well.  Patient will be prescribed Zofran for nausea/vomiting management.  Patient was given return precautions.patient stable for discharge at this time.  Patient verbalized understanding of plan.         Final Clinical Impression(s) / ED Diagnoses Final diagnoses:  Nausea and vomiting during pregnancy    Rx / DC Orders ED Discharge Orders          Ordered    ondansetron (ZOFRAN) 4 MG tablet  Every 6 hours        05/09/22 1532              Elvina Sidle 05/09/22 1537    Pattricia Boss, MD 05/09/22 [phone]

## 2022-05-09 NOTE — Discharge Instructions (Addendum)
Take the Zofran I have prescribed for you and take as prescribed.  Nausea vomiting.  Please follow-up with your OB tomorrow at [facility] regarding recent symptoms and ER visit.  If you start to experience worsening symptoms please go to the MAU at [facility] to be evaluated.

## 2022-05-09 NOTE — ED Triage Notes (Addendum)
Patient is [redacted] weeks pregnant (first child) and has not been able to keep anything down for 3 days. Stated her abdominal feels tight but not in pain. Seeks prenatal care. Ran out of zofran 1 week ago.

## 2022-06-22 IMAGING — DX DG CHEST 1V PORT
1 series · 1 of 1 positions shown · non-contrast
Comparison: Chest radiograph 07/31/2020.

CLINICAL DATA: Nausea.  Cough

EXAM:
PORTABLE CHEST 1 VIEW

[chest ap]
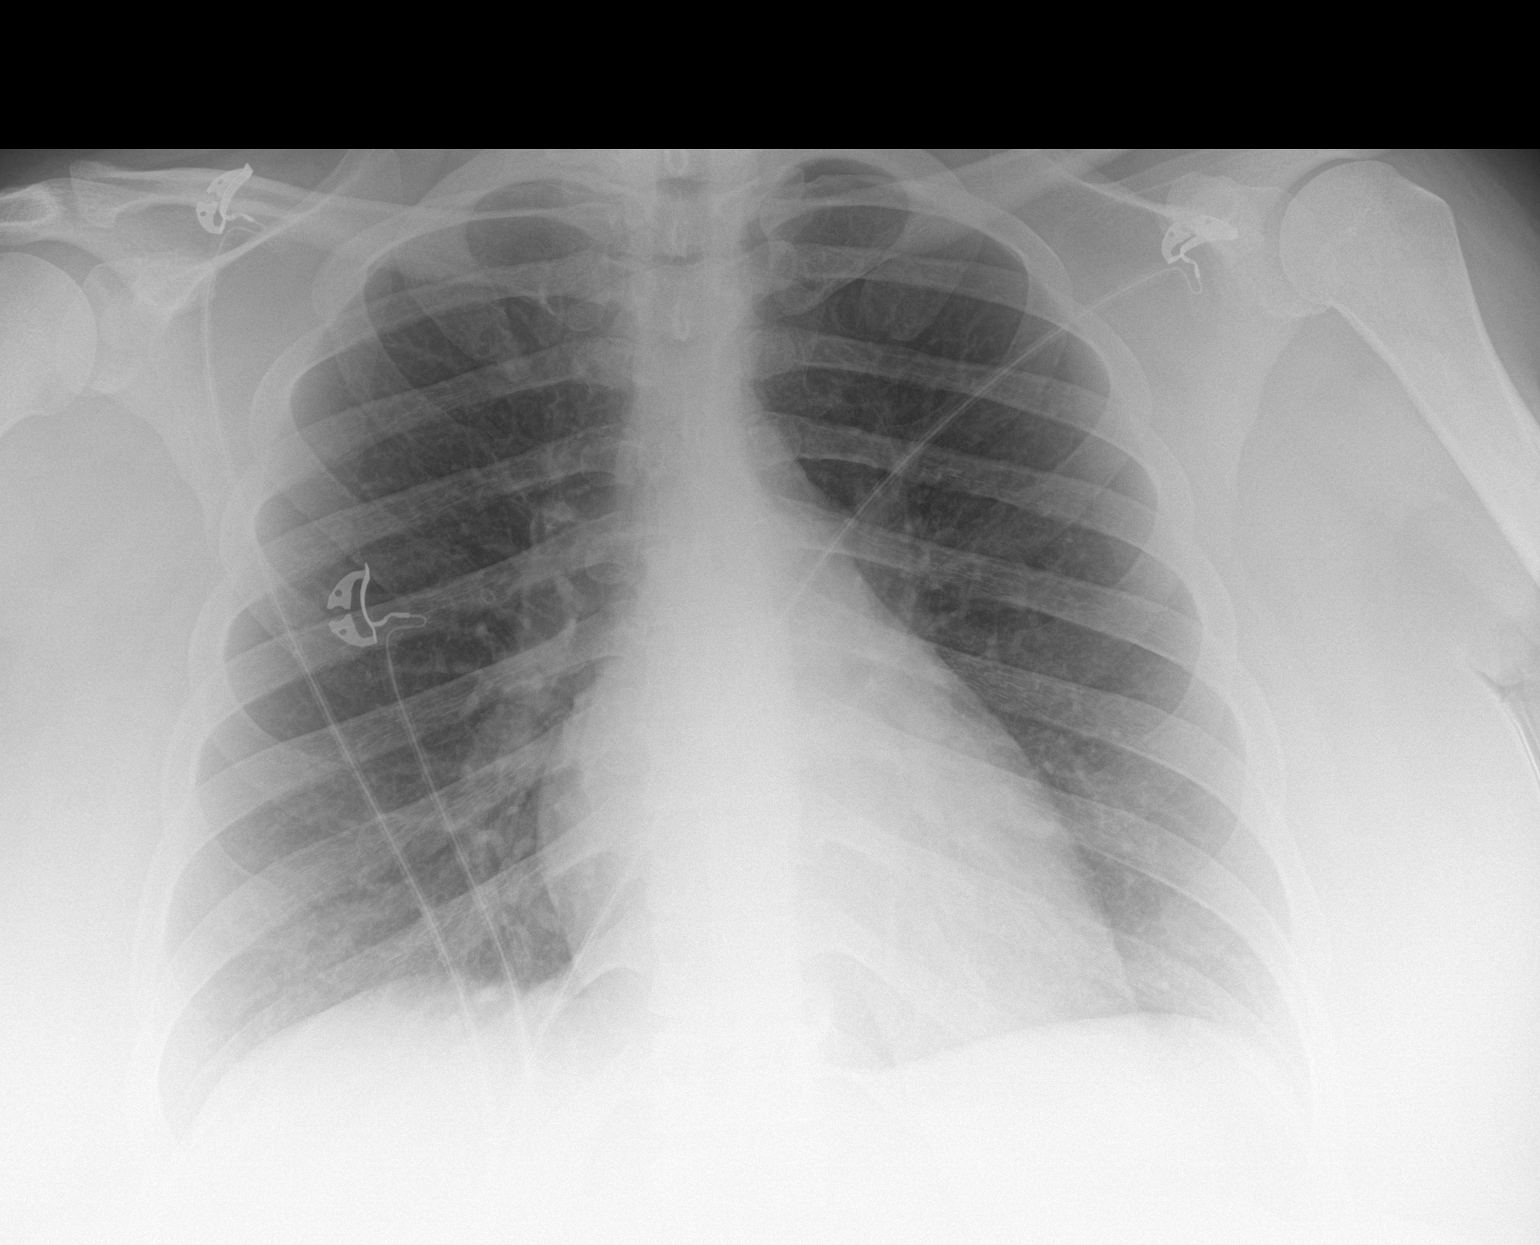

[1 of 1 positions shown; findings below may reference images not displayed]

FINDINGS: The heart size and mediastinal contours are within normal limits.
Both lungs are clear. The visualized skeletal structures are
unremarkable.
IMPRESSION: No active disease.

## 2022-09-06 ENCOUNTER — Other Ambulatory Visit: Payer: Self-pay

## 2022-09-06 ENCOUNTER — Emergency Department (HOSPITAL_COMMUNITY)
Admission: EM | Admit: 2022-09-06 | Discharge: 2022-09-07 | Disposition: A | Discharge status: Home / Self Care | Payer: Medicaid Other | Attending: Emergency Medicine | Admitting: Emergency Medicine

## 2022-09-06 ENCOUNTER — Encounter (HOSPITAL_COMMUNITY): Payer: Self-pay | Admitting: Emergency Medicine

## 2022-09-06 DIAGNOSIS — T25222A Burn of second degree of left foot, initial encounter: Secondary | ICD-10-CM | POA: Insufficient documentation

## 2022-09-06 DIAGNOSIS — X102XXA Contact with fats and cooking oils, initial encounter: Secondary | ICD-10-CM | POA: Insufficient documentation
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
## 2022-11-21 ENCOUNTER — Encounter (HOSPITAL_COMMUNITY): Payer: Self-pay

## 2022-11-21 ENCOUNTER — Other Ambulatory Visit: Payer: Self-pay

## 2022-11-21 ENCOUNTER — Emergency Department (HOSPITAL_COMMUNITY)
Admission: EM | Admit: 2022-11-21 | Discharge: 2022-11-21 | Disposition: A | Discharge status: Home / Self Care | Payer: Medicaid Other | Attending: Student | Admitting: Student

## 2022-11-21 DIAGNOSIS — Z23 Encounter for immunization: Secondary | ICD-10-CM | POA: Diagnosis not present

## 2022-11-21 DIAGNOSIS — Z2914 Encounter for prophylactic rabies immune globin: Secondary | ICD-10-CM | POA: Insufficient documentation

## 2022-11-21 DIAGNOSIS — S61452A Open bite of left hand, initial encounter: Secondary | ICD-10-CM | POA: Diagnosis not present

## 2022-11-21 DIAGNOSIS — W5501XA Bitten by cat, initial encounter: Secondary | ICD-10-CM | POA: Insufficient documentation

## 2022-11-21 DIAGNOSIS — S41152A Open bite of left upper arm, initial encounter: Secondary | ICD-10-CM | POA: Diagnosis present

## 2022-11-21 MED ORDER — RABIES VACCINE, PCEC IM SUSR
1.0000 mL | Freq: Once | INTRAMUSCULAR | Status: AC
  Administered 2022-11-21: 1 mL via INTRAMUSCULAR
  Filled 2022-11-21: qty 1

## 2022-11-21 MED ORDER — AMOXICILLIN-POT CLAVULANATE 875-125 MG PO TABS
1.0000 | ORAL_TABLET | Freq: Once | ORAL | Status: AC
  Administered 2022-11-21: 1 via ORAL
  Filled 2022-11-21: qty 1

## 2022-11-21 MED ORDER — RABIES IMMUNE GLOBULIN 150 UNIT/ML IM INJ
20.0000 [IU]/kg | INJECTION | Freq: Once | INTRAMUSCULAR | Status: AC
  Administered 2022-11-21: 2475 [IU] via INTRAMUSCULAR
  Filled 2022-11-21: qty 20

## 2022-11-21 MED ORDER — AMOXICILLIN-POT CLAVULANATE 875-125 MG PO TABS: 1.0000 | ORAL_TABLET | Freq: Two times a day (BID) | ORAL | 0 refills | Status: AC
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
## 2022-11-21 NOTE — ED Provider Notes (Signed)
[place] EMERGENCY DEPARTMENT AT [facility] Provider Note  CSN: [id number] Arrival date & time: 11/21/22 1254  Chief Complaint(s) Animal Bite  HPI Cassandra Houston is a 32 y.o. female who presents emergency room for evaluation of multiple cat bites.  Patient states that her cat ran under her car where there was a stray cat that was also hiding in the car that then attacked her.  Patient arrives with multiple puncture wounds and scratches to the left upper extremity, over the hand and forearm.  The cat has since run away and has not been quarantined.  Unsure of cats vaccination status.  Endorses pain at the lower extremity but denies fever, chest pain, shortness of breath, Donnell pain, nausea, vomiting or other systemic symptoms.   Past Medical History History reviewed. No pertinent past medical history. There are no problems to display for this patient.  Home Medication(s) Prior to Admission medications   Medication Sig Start Date End Date Taking? Authorizing Provider  benzonatate (TESSALON) 100 MG capsule Take 1 capsule (100 mg total) by mouth every 8 (eight) hours. 12/06/20   Mannie Stabile, PA-C  cephALEXin (KEFLEX) 500 MG capsule Take 1 capsule by mouth 4 times daily. 10/26/21   Margarita Grizzle, MD  ferrous sulfate 324 MG TBEC Take 324 mg by mouth daily.    [provider]  ondansetron (ZOFRAN ODT) 4 MG disintegrating tablet Take 1 tablet (4 mg total) by mouth every 8 (eight) hours as needed for nausea or vomiting. 12/06/20   Mannie Stabile, PA-C  ondansetron (ZOFRAN) 4 MG tablet Take 1 tablet (4 mg total) by mouth every 6 (six) hours. 05/09/22   Remi Deter                                                                                                                                    Past Surgical History History reviewed. No pertinent surgical history. Family History History reviewed. No pertinent family history.  Social History Social  History   Tobacco Use   Smoking status: Never  Substance Use Topics   Alcohol use: Not Currently    Comment: rarely   Drug use: Not Currently   Allergies Patient has no known allergies.  Review of Systems Review of Systems  Skin:  Positive for wound.    Physical Exam Vital Signs  I have reviewed the triage vital signs BP (!) 137/91   Pulse 85   Temp 98.1 F (36.7 C) (Oral)   Resp 18   Ht 5\' 4"  (1.626 m)   Wt 124.7 kg   SpO2 100%   BMI 47.20 kg/m   Physical Exam Vitals and nursing note reviewed.  Constitutional:      General: She is not in acute distress.    Appearance: She is well-developed.  HENT:     Head: Normocephalic and atraumatic.  Eyes:     Conjunctiva/sclera: Conjunctivae normal.  Cardiovascular:     Rate and Rhythm: Normal rate and regular rhythm.     Heart sounds: No murmur heard. Pulmonary:     Effort: Pulmonary effort is normal. No respiratory distress.     Breath sounds: Normal breath sounds.  Abdominal:     Palpations: Abdomen is soft.     Tenderness: There is no abdominal tenderness.  Musculoskeletal:        General: No swelling.     Cervical back: Neck supple.  Skin:    General: Skin is warm and dry.     Capillary Refill: Capillary refill takes less than 2 seconds.     Findings: Lesion present.  Neurological:     Mental Status: She is alert.  Psychiatric:        Mood and Affect: Mood normal.     ED Results and Treatments Labs (all labs ordered are listed, but only abnormal results are displayed) Labs Reviewed - No data to display                                                                                                                        Radiology No results found.  Pertinent labs & imaging results that were available during my care of the patient were reviewed by me and considered in my medical decision making (see MDM for details).  Medications Ordered in ED Medications  amoxicillin-clavulanate (AUGMENTIN) 875-125  MG per tablet 1 tablet (has no administration in time range)                                                                                                                                     Procedures Procedures  (including critical care time)  Medical Decision Making / ED Course   This patient presents to the ED for concern of cat bite, this involves an extensive number of treatment options, and is a complaint that carries with it a high risk of complications and morbidity.  The differential diagnosis includes cat bite, cellulitis, rabies exposure, fracture  MDM: Patient seen emergency room for evaluation of a cat bite with multiple scratches.  Physical exam with multiple puncture wounds, scratches over the left upper extremity and hand.  Patient covered with Augmentin.  I did speak with animal control and because the cat has since run away and location is unknown, it is unable to be quarantined and patient will require rabies  Ig and vaccines.  Patient received the immunoglobulin and first dose of vaccine today.  She was instructed to call the Department of Public Health for her follow-up vaccines or return to the emergency department on day 3, 7, 14.  Low suspicion for fracture given physical exam.  At this time she does not meet inpatient criteria for admission and is safe for discharge with outpatient follow-up.  Patient discharged with Augmentin and return precautions for completion of her vaccine course.   Additional history obtained:  -External records from outside source obtained and reviewed including: Chart review including previous notes, labs, imaging, consultation notes    Medicines ordered and prescription drug management: Meds ordered this encounter  Medications   amoxicillin-clavulanate (AUGMENTIN) 875-125 MG per tablet 1 tablet    -I have reviewed the patients home medicines and have made adjustments as needed  Critical interventions none  Consultations  Obtained: I requested consultation with the animal control,  and discussed lab and imaging findings as well as pertinent plan - they recommend: Rabies prophylaxis   Social Determinants of Health:  Factors impacting patients care include: none   Reevaluation: After the interventions noted above, I reevaluated the patient and found that they have :improved  Co morbidities that complicate the patient evaluation History reviewed. No pertinent past medical history.    Dispostion: I considered admission for this patient, but at this time she does not meet inpatient criteria for admission and she is safe for discharge with outpatient follow-up     Final Clinical Impression(s) / ED Diagnoses Final diagnoses:  None     @PCDICTATION @    Glendora Score, MD 11/21/22 2019

## 2023-09-27 ENCOUNTER — Other Ambulatory Visit (HOSPITAL_BASED_OUTPATIENT_CLINIC_OR_DEPARTMENT_OTHER): Payer: Self-pay
